# Patient Record
Sex: Female | Born: 1975 | Race: White | Hispanic: No | Marital: Single | Smoking: Current every day smoker
Health system: Southern US, Community
[De-identification: ages and names within clinical notes are randomized; demographics above are authoritative.]

## PROBLEM LIST (undated history)

## (undated) DIAGNOSIS — D72829 Elevated white blood cell count, unspecified: Secondary | ICD-10-CM

## (undated) DIAGNOSIS — L089 Local infection of the skin and subcutaneous tissue, unspecified: Secondary | ICD-10-CM

## (undated) DIAGNOSIS — K589 Irritable bowel syndrome without diarrhea: Secondary | ICD-10-CM

## (undated) HISTORY — DX: Elevated white blood cell count, unspecified: D72.829

## (undated) HISTORY — DX: Irritable bowel syndrome, unspecified: K58.9

## (undated) HISTORY — PX: NASAL SINUS SURGERY: SHX719

## (undated) HISTORY — DX: Local infection of the skin and subcutaneous tissue, unspecified: L08.9

---

## 2002-05-24 ENCOUNTER — Other Ambulatory Visit: Admission: RE | Admit: 2002-05-24 | Discharge: 2002-05-24 | Payer: Self-pay | Admitting: Obstetrics and Gynecology

## 2004-08-06 ENCOUNTER — Other Ambulatory Visit: Admission: RE | Admit: 2004-08-06 | Discharge: 2004-08-06 | Payer: Self-pay | Admitting: Family Medicine

## 2004-11-04 ENCOUNTER — Encounter: Admission: RE | Admit: 2004-11-04 | Discharge: 2004-11-04 | Payer: Self-pay | Admitting: Family Medicine

## 2005-07-19 ENCOUNTER — Encounter: Admission: RE | Admit: 2005-07-19 | Discharge: 2005-07-19 | Payer: Self-pay | Admitting: Family Medicine

## 2006-09-07 ENCOUNTER — Emergency Department (HOSPITAL_COMMUNITY): Admission: EM | Admit: 2006-09-07 | Discharge: 2006-09-08 | Payer: Self-pay | Admitting: Emergency Medicine

## 2009-03-13 ENCOUNTER — Encounter: Admission: RE | Admit: 2009-03-13 | Discharge: 2009-03-13 | Payer: Self-pay | Admitting: Family Medicine

## 2010-12-21 LAB — URINALYSIS, ROUTINE W REFLEX MICROSCOPIC
Glucose, UA: NEGATIVE
Ketones, ur: NEGATIVE
Specific Gravity, Urine: 1.009

## 2010-12-21 LAB — CBC
HCT: 37.2
Hemoglobin: 13
MCHC: 34.8
RBC: 4.1
WBC: 15.6 — ABNORMAL HIGH

## 2010-12-21 LAB — DIFFERENTIAL
Basophils Absolute: 0
Basophils Relative: 0
Eosinophils Absolute: 0.1
Lymphs Abs: 1.6
Neutrophils Relative %: 80 — ABNORMAL HIGH

## 2010-12-21 LAB — URINE MICROSCOPIC-ADD ON

## 2010-12-21 LAB — URINE CULTURE: Colony Count: 3000

## 2010-12-21 LAB — BASIC METABOLIC PANEL
CO2: 21
Creatinine, Ser: 0.7

## 2010-12-28 ENCOUNTER — Ambulatory Visit: Payer: Self-pay

## 2012-05-21 ENCOUNTER — Telehealth: Payer: Self-pay | Admitting: Oncology

## 2012-05-21 ENCOUNTER — Other Ambulatory Visit: Payer: Self-pay | Admitting: Oncology

## 2012-05-21 DIAGNOSIS — D72829 Elevated white blood cell count, unspecified: Secondary | ICD-10-CM

## 2012-05-21 NOTE — Telephone Encounter (Signed)
S/W PT IN REF TO NP APPT. ON 06/11/12@3 :00 REFERRING DR Dorothey Baseman DX-MILD CHRONIC ELEVATION OF WBC  MAILED NP PACKET

## 2012-05-21 NOTE — Telephone Encounter (Signed)
C/D 05/21/12 for appt.06/11/12

## 2012-06-11 ENCOUNTER — Ambulatory Visit (HOSPITAL_BASED_OUTPATIENT_CLINIC_OR_DEPARTMENT_OTHER): Payer: PRIVATE HEALTH INSURANCE

## 2012-06-11 ENCOUNTER — Encounter: Payer: Self-pay | Admitting: Oncology

## 2012-06-11 ENCOUNTER — Other Ambulatory Visit (HOSPITAL_BASED_OUTPATIENT_CLINIC_OR_DEPARTMENT_OTHER): Payer: PRIVATE HEALTH INSURANCE | Admitting: Lab

## 2012-06-11 ENCOUNTER — Telehealth: Payer: Self-pay | Admitting: Oncology

## 2012-06-11 ENCOUNTER — Ambulatory Visit (HOSPITAL_BASED_OUTPATIENT_CLINIC_OR_DEPARTMENT_OTHER): Payer: PRIVATE HEALTH INSURANCE | Admitting: Oncology

## 2012-06-11 VITALS — BP 123/87 | HR 85 | Temp 98.2°F | Resp 20 | Ht 62.0 in | Wt 172.2 lb

## 2012-06-11 DIAGNOSIS — D72829 Elevated white blood cell count, unspecified: Secondary | ICD-10-CM

## 2012-06-11 DIAGNOSIS — D72821 Monocytosis (symptomatic): Secondary | ICD-10-CM

## 2012-06-11 DIAGNOSIS — B999 Unspecified infectious disease: Secondary | ICD-10-CM

## 2012-06-11 DIAGNOSIS — D720 Genetic anomalies of leukocytes: Secondary | ICD-10-CM

## 2012-06-11 HISTORY — DX: Elevated white blood cell count, unspecified: D72.829

## 2012-06-11 LAB — CBC WITH DIFFERENTIAL/PLATELET
BASO%: 0.4 % (ref 0.0–2.0)
HCT: 40.7 % (ref 34.8–46.6)
HGB: 13.8 g/dL (ref 11.6–15.9)
MCHC: 33.9 g/dL (ref 31.5–36.0)
MONO#: 1 10*3/uL — ABNORMAL HIGH (ref 0.1–0.9)
MONO%: 9.6 % (ref 0.0–14.0)
NEUT#: 6.8 10*3/uL — ABNORMAL HIGH (ref 1.5–6.5)
WBC: 10.4 10*3/uL — ABNORMAL HIGH (ref 3.9–10.3)

## 2012-06-11 LAB — COMPREHENSIVE METABOLIC PANEL (CC13)
AST: 18 U/L (ref 5–34)
Albumin: 3.7 g/dL (ref 3.5–5.0)
Glucose: 98 mg/dl (ref 70–99)
Sodium: 138 mEq/L (ref 136–145)

## 2012-06-11 LAB — MORPHOLOGY: PLT EST: ADEQUATE

## 2012-06-11 LAB — CHCC SMEAR

## 2012-06-11 NOTE — Progress Notes (Signed)
Checked in new patient. No financial issues. °

## 2012-06-11 NOTE — Telephone Encounter (Signed)
Gave pt appt for October 2014 lab only

## 2012-06-11 NOTE — Patient Instructions (Addendum)
1.  Elevated White blood cell. 2.  Impression:  Chronic.  Most likely benign.  My review of peripheral blood smear was benign with no concern for leukemia/lymphoma.  3.  Recommendation:  Watchful observation.  In the future, if white blood cell significantly worsens, we may consider further work up.   4.  Follow up:  Lab test in about 6 months.  Return visit in about 1 year.

## 2012-06-13 ENCOUNTER — Telehealth: Payer: Self-pay | Admitting: Oncology

## 2012-06-13 NOTE — Progress Notes (Signed)
[facility]  Telephone:(336) [phone] Fax:(336) [phone]     INITIAL HEMATOLOGY CONSULTATION    Referral MD:  Tomasa Rand, R.N.  [employer].   Reason for Referral: chronic leukocytosis.     HPI: Sydney Spencer is a 37 year-old woman with IBS.  She has had chronic leukocytosis for many years.  The oldest available CBC provided dated back to 06/13/2005 where WBC 11.3; Htb 14.3; Plt 198; ANC 8.2; mono 1.0.  The latest CBC dated 04/07/2011 showed WBC 12.6; Hgb 14.3; plt 243. She also has recurrent soft tissue infections. She sustains cellulitis and abscess easily when she scraps her skin.  Therefore, her occupational health office kindly referred her to the Cancer for evaluation.  Sydney Spencer presented to clinic by herself today.  She has mild fatigue.  However, she works full time.  She denies active skin rash, cellulitis at this time.  She had intermittent adenopathy behind her ears and axilla.  There is no relation to the onset or temporary resolution of these swollen nodes.  She has mild hot flash which she attributes to early menopause.  She denied hot flash, anorexia, weight loss, fever, SOB, chest pain, cough, hemoptysis, bone pain, joint pain, joint swelling, skin rash, sun sensitivity, abdominal pain, diarrhea/constipation, melena, hematochezia, hematuria, bone pain.     Past Medical History  Diagnosis Date  . Leukocytosis, unspecified 06/11/2012  . IBS (irritable bowel syndrome)     colonoscopy around 2012 [facility]).   :    History reviewed. No pertinent past surgical history.:   CURRENT MEDS: Current Outpatient Prescriptions  Medication Sig Dispense Refill  . Ascorbic Acid (VITAMIN C) 100 MG tablet Take 100 mg by mouth daily.      . cholecalciferol (VITAMIN D) 1000 UNITS tablet Take 1,000 Units by mouth daily.      . fish oil-omega-3 fatty acids 1000 MG capsule Take 2 g by mouth daily.      . Melatonin 1 MG CAPS  Take by mouth. Take as directed      . vitamin B-12 (CYANOCOBALAMIN) 100 MCG tablet Take 50 mcg by mouth daily.       No current facility-administered medications for this visit.      Allergies  Allergen Reactions  . Latex Itching    And rash  :  Family History  Problem Relation Age of Onset  . Cancer Father     Lung?  . Cancer Paternal Grandmother     pancreas cancer  :  History   Social History  . Marital Status: Single    Spouse Name: N/A    Number of Children: 0  . Years of Education: N/A   Occupational History  .      [employer], [employer]   Social History Main Topics  . Smoking status: Current Every Day Smoker -- 1.00 packs/day for 20 years  . Smokeless tobacco: Never Used  . Alcohol Use: No  . Drug Use: Not on file  . Sexually Active: Not on file   Other Topics Concern  . Not on file   Social History Narrative  . No narrative on file  :  REVIEW OF SYSTEM:  The rest of the 14-point review of sytem was negative.   Exam: ECOG 0-1  General:  well-nourished woman, in no acute distress.  Eyes:  no scleral icterus.  ENT:  There were no oropharyngeal lesions.  Neck was without thyromegaly.  Lymphatics:  Negative posterior auricular,  cervical, supraclavicular, axillary, or inguinal adenopathy.  Respiratory: lungs were clear bilaterally without wheezing or crackles.  Cardiovascular:  Regular rate and rhythm, S1/S2, without murmur, rub or gallop.  There was no pedal edema.  GI:  abdomen was soft, flat, nontender, nondistended, without organomegaly.  Muscoloskeletal:  no spinal tenderness of palpation of vertebral spine.  Skin exam was without echymosis, petichae.  I did not identify any active skin erythema/purulent discharge/fluctulance today.  Neuro exam was nonfocal.  Patient was able to get on and off exam table without assistance.  Gait was normal.  Patient was alerted and oriented.  Attention was good.   Language was appropriate.  Mood was normal without  depression.  Speech was not pressured.  Thought content was not tangential.    LABS:  Lab Results  Component Value Date   WBC 10.4* 06/11/2012   HGB 13.8 06/11/2012   HCT 40.7 06/11/2012   PLT 196 06/11/2012   GLUCOSE 98 06/11/2012   ALT 18 06/11/2012   AST 18 06/11/2012   NA 138 06/11/2012   K 3.9 06/11/2012   CL 103 06/11/2012   CREATININE 0.7 06/11/2012   BUN 9.9 06/11/2012   CO2 25 06/11/2012    Blood smear review:   I personally reviewed the patient's peripheral blood smear today.  There was isocytosis.  There was no peripheral blast.  There was no schistocytosis, spherocytosis, target cell, rouleaux formation, tear drop cell.  There was no giant platelets or platelet clumps.      ASSESSMENT AND PLAN:   1.  Chronic neutrophilia and monocytosis: - Differentials:  Most likely reactive.  I have low pretest probability for an acute myeloproliferative process (acute leukemia) given that it has been stable since at least 2007.  Also, on differential is chronic myeloproliferative disease.  However, given lack of definitive constitutional symptoms or disease manifestation, in addition to chronic mild neutrophilia and monocytosis, a diagnostic bone marrow biopsy at this time is premature.  - Work up:  None indicated at this time. - Follow up:  Repeat CBC at the Cancer Center in about 6 months.  Return visit in about 1 year.  In the future, if her neutrophilia and monocytosis significantly worsen, a bone marrow biopsy may be considered.   2.  Recurrent soft tissue infection: - Differential:  Neutrophil dysfunction.  Patient was concerned about potential being Staph carrier. - Work up:  I will send for neutrophil function test.  I recommended her following up with Dr. Michaelle Birks for her expertise in working up Staph carrier status.    Sydney Spencer expressed informed understanding and wised to proceed as recommended.    The length of time of the face-to-face encounter was 30 minutes. More than 50% of time was  spent counseling and coordination of care.     Thank you for this referral.      Deriona Altemose T. Gaylyn Rong, M.D.

## 2012-06-13 NOTE — Telephone Encounter (Signed)
Talked to pt , gave her appt for lab 4/16 per Dr. Gaylyn Rong,

## 2012-06-20 ENCOUNTER — Other Ambulatory Visit (HOSPITAL_BASED_OUTPATIENT_CLINIC_OR_DEPARTMENT_OTHER): Payer: PRIVATE HEALTH INSURANCE | Admitting: Lab

## 2012-06-20 ENCOUNTER — Other Ambulatory Visit: Payer: Self-pay | Admitting: *Deleted

## 2012-06-20 DIAGNOSIS — D720 Genetic anomalies of leukocytes: Secondary | ICD-10-CM

## 2012-08-29 ENCOUNTER — Other Ambulatory Visit: Payer: Self-pay | Admitting: Oncology

## 2012-08-29 ENCOUNTER — Telehealth: Payer: Self-pay | Admitting: *Deleted

## 2012-08-29 ENCOUNTER — Encounter: Payer: Self-pay | Admitting: Oncology

## 2012-08-29 NOTE — Telephone Encounter (Signed)
Dr. Gaylyn Rong attempted to call pt back x 2 today and she did not answer.  He mailed letter to her home explaining lab results.

## 2012-08-29 NOTE — Telephone Encounter (Signed)
Pt requesting results of labs from visit in April.  She says "Dr. Gaylyn Rong did some extensive testing, but I have not heard about the results."

## 2012-12-10 ENCOUNTER — Telehealth: Payer: Self-pay | Admitting: *Deleted

## 2012-12-10 DIAGNOSIS — D72829 Elevated white blood cell count, unspecified: Secondary | ICD-10-CM

## 2012-12-10 NOTE — Telephone Encounter (Signed)
Pt asking about reason for lab appt this week?  Explained Dr. Gaylyn Rong recommended Labs in 6 months from last visit to make sure there are no changes and no need for any further testing.  Pt verbalized understanding and plans to keep appt this week.

## 2012-12-11 ENCOUNTER — Other Ambulatory Visit: Payer: Self-pay | Admitting: Hematology and Oncology

## 2012-12-11 DIAGNOSIS — D72829 Elevated white blood cell count, unspecified: Secondary | ICD-10-CM

## 2012-12-12 ENCOUNTER — Other Ambulatory Visit: Payer: PRIVATE HEALTH INSURANCE | Admitting: Lab

## 2012-12-12 ENCOUNTER — Telehealth: Payer: Self-pay | Admitting: Hematology and Oncology

## 2012-12-12 NOTE — Telephone Encounter (Signed)
returned pt call and r/s miss lab...done

## 2012-12-13 ENCOUNTER — Encounter (INDEPENDENT_AMBULATORY_CARE_PROVIDER_SITE_OTHER): Payer: Self-pay

## 2012-12-13 ENCOUNTER — Other Ambulatory Visit (HOSPITAL_BASED_OUTPATIENT_CLINIC_OR_DEPARTMENT_OTHER): Payer: PRIVATE HEALTH INSURANCE

## 2012-12-13 DIAGNOSIS — D72829 Elevated white blood cell count, unspecified: Secondary | ICD-10-CM

## 2012-12-13 LAB — CBC WITH DIFFERENTIAL/PLATELET
Basophils Absolute: 0.1 10*3/uL (ref 0.0–0.1)
EOS%: 1.4 % (ref 0.0–7.0)
Eosinophils Absolute: 0.3 10*3/uL (ref 0.0–0.5)
MCH: 31.5 pg (ref 25.1–34.0)
MONO#: 1.7 10*3/uL — ABNORMAL HIGH (ref 0.1–0.9)
NEUT#: 14.5 10*3/uL — ABNORMAL HIGH (ref 1.5–6.5)
NEUT%: 79.1 % — ABNORMAL HIGH (ref 38.4–76.8)
Platelets: 231 10*3/uL (ref 145–400)
WBC: 18.3 10*3/uL — ABNORMAL HIGH (ref 3.9–10.3)
lymph#: 1.8 10*3/uL (ref 0.9–3.3)

## 2012-12-13 LAB — URINALYSIS, MICROSCOPIC - CHCC
Bacteria, UA: NEGATIVE
Bilirubin (Urine): NEGATIVE
Ketones: NEGATIVE mg/dL
Leukocyte Esterase: NEGATIVE
Specific Gravity, Urine: 1.005 (ref 1.003–1.035)
WBC, UA: NEGATIVE (ref 0–2)

## 2013-01-03 ENCOUNTER — Telehealth: Payer: Self-pay | Admitting: *Deleted

## 2013-01-03 NOTE — Telephone Encounter (Signed)
Pt called for results of labs she had done earlier this month.

## 2013-01-03 NOTE — Telephone Encounter (Signed)
Her CBC is grossly abnormal Recommend she returns to see me for further evaluation Next available 30 minutes appt

## 2013-01-04 ENCOUNTER — Telehealth: Payer: Self-pay | Admitting: *Deleted

## 2013-01-04 ENCOUNTER — Telehealth: Payer: Self-pay | Admitting: Hematology and Oncology

## 2013-01-04 NOTE — Telephone Encounter (Signed)
s.w. pt and tried to give appt...pt did not want to sched appt due to work sched and wanted results over the phone transfered pt to [employer] nurse

## 2013-01-07 NOTE — Telephone Encounter (Signed)
Opened encounter in error  

## 2013-01-08 ENCOUNTER — Telehealth: Payer: Self-pay | Admitting: Hematology and Oncology

## 2013-01-08 NOTE — Telephone Encounter (Signed)
returned pt call and sched visit. 11.10.14

## 2013-01-08 NOTE — Telephone Encounter (Signed)
called pt back to make aware that appt on 11.10.14 has been moved to 11.7.14

## 2013-01-11 ENCOUNTER — Ambulatory Visit (HOSPITAL_BASED_OUTPATIENT_CLINIC_OR_DEPARTMENT_OTHER): Payer: PRIVATE HEALTH INSURANCE | Admitting: Hematology and Oncology

## 2013-01-11 ENCOUNTER — Encounter: Payer: Self-pay | Admitting: Hematology and Oncology

## 2013-01-11 VITALS — BP 129/86 | HR 77 | Temp 97.2°F | Resp 18 | Ht 62.0 in | Wt 168.7 lb

## 2013-01-11 DIAGNOSIS — L089 Local infection of the skin and subcutaneous tissue, unspecified: Secondary | ICD-10-CM

## 2013-01-11 DIAGNOSIS — F17200 Nicotine dependence, unspecified, uncomplicated: Secondary | ICD-10-CM

## 2013-01-11 DIAGNOSIS — D72829 Elevated white blood cell count, unspecified: Secondary | ICD-10-CM

## 2013-01-11 DIAGNOSIS — L732 Hidradenitis suppurativa: Secondary | ICD-10-CM

## 2013-01-11 MED ORDER — CLINDAMYCIN PHOSPHATE 1 % EX GEL: Freq: Two times a day (BID) | CUTANEOUS | Status: DC

## 2013-01-11 MED ORDER — CLINDAMYCIN PHOS-BENZOYL PEROX 1-5 % EX GEL: Freq: Two times a day (BID) | CUTANEOUS | Status: DC

## 2013-01-11 NOTE — Progress Notes (Signed)
[facility] OFFICE PROGRESS NOTE  Emeterio Reeve, MD DIAGNOSIS:  Chronic leukocytosis  SUMMARY OF HEMATOLOGIC HISTORY: This is a pleasant 37 year old lady with prior diagnosis of chronic leukocytosis. The abnormal CBC was discovered as part of the routine visit. The patient has history of recurrent skin infection. INTERVAL HISTORY: Douglas Rooks 37 y.o. female returns for further followup. She denies any atypical infection such as pneumonia requiring hospitalization or shingles. No prior history of meningitis. The typical skin infection she described are behind the ears, around her groin and underneath her armpits. She had been putting some topical cream behind the ears for long time and is not helping. Her last antibiotic treatment is more than 6 months ago.  I have reviewed the past medical history, past surgical history, social history and family history with the patient and they are unchanged from previous note.  ALLERGIES:  is allergic to latex.  MEDICATIONS:  Current Outpatient Prescriptions  Medication Sig Dispense Refill  . Ascorbic Acid (VITAMIN C) 100 MG tablet Take 100 mg by mouth daily.      . cholecalciferol (VITAMIN D) 1000 UNITS tablet Take 1,000 Units by mouth daily.      . fish oil-omega-3 fatty acids 1000 MG capsule Take 2 g by mouth daily.      . Melatonin 1 MG CAPS Take by mouth. Take as directed      . vitamin B-12 (CYANOCOBALAMIN) 100 MCG tablet Take 50 mcg by mouth daily.      . clindamycin (CLINDAGEL) 1 % gel Apply topically 2 (two) times daily.  60 g  3  . clindamycin-benzoyl peroxide (BENZACLIN) gel Apply topically 2 (two) times daily.  50 g  3   No current facility-administered medications for this visit.     REVIEW OF SYSTEMS:   Constitutional: Denies fevers, chills or night sweats Eyes: Denies blurriness of vision Ears, nose, mouth, throat, and face: Denies mucositis or sore throat Respiratory: Denies cough, dyspnea or  wheezes Cardiovascular: Denies palpitation, chest discomfort or lower extremity swelling Gastrointestinal:  Denies nausea, heartburn or change in bowel habits Lymphatics: Denies new lymphadenopathy or easy bruising Neurological:Denies numbness, tingling or new weaknesses Behavioral/Psych: Mood is stable, no new changes  All other systems were reviewed with the patient and are negative.  PHYSICAL EXAMINATION: ECOG PERFORMANCE STATUS: 0 - Asymptomatic  Filed Vitals:   01/11/13 1343  BP: 129/86  Pulse: 77  Temp: 97.2 F (36.2 C)  Resp: 18   Filed Weights   01/11/13 1343  Weight: 168 lb 11.2 oz (76.522 kg)    GENERAL:alert, no distress and comfortable SKIN: skin color, texture, turgor are normal, no rashes or significant lesions. Noticed scarring behind the ears and her groin area EYES: normal, Conjunctiva are pink and non-injected, sclera clear OROPHARYNX:no exudate, no erythema and lips, buccal mucosa, and tongue normal  NECK: supple, thyroid normal size, non-tender, without nodularity LYMPH:  no palpable lymphadenopathy in the cervical, axillary or inguinal LUNGS: clear to auscultation and percussion with normal breathing effort HEART: regular rate & rhythm and no murmurs and no lower extremity edema ABDOMEN:abdomen soft, non-tender and normal bowel sounds Musculoskeletal:no cyanosis of digits and no clubbing  NEURO: alert & oriented x 3 with fluent speech, no focal motor/sensory deficits  LABORATORY DATA:  I have reviewed the data as listed ASSESSMENT:  Recurrent leukocytosis, reactive in nature  PLAN:  #1 recurrent leukocytosis This is reactive in nature. The white count has fluctuate between low normal to a little high. The  predominant increased leukocytosis are neutrophilia and monocytosis, consistent with infection. The most likely source of infection is on her skin. The patient also has a smoker's cough. I do not feel this warrant further investigation. #2 chronic  skin infection This is consistent with chronic suppurative hydradenitis. I gave her some patient education information. She may need referral to another dermatologist for possible incision and drainage. I gave her a prescription of topical clindamycin cream as well as benzoyl peroxide to treat this. I encouraged her to quit smoking and reduce carbohydrate intake. We also focused on improving hygiene #3 chronic smoking I spent some time educating the patient importance of nicotine cessation All questions were answered. The patient knows to call the clinic with any problems, questions or concerns. No barriers to learning was detected.    Gilmore List, MD 01/11/2013 3:10 PM

## 2013-01-14 ENCOUNTER — Ambulatory Visit: Payer: PRIVATE HEALTH INSURANCE | Admitting: Hematology and Oncology

## 2013-06-03 ENCOUNTER — Ambulatory Visit
Admission: RE | Admit: 2013-06-03 | Discharge: 2013-06-03 | Disposition: A | Discharge status: Home / Self Care | Payer: PRIVATE HEALTH INSURANCE | Source: Ambulatory Visit | Attending: Family Medicine | Admitting: Family Medicine

## 2013-06-03 ENCOUNTER — Other Ambulatory Visit: Payer: Self-pay | Admitting: Family Medicine

## 2013-06-03 DIAGNOSIS — R079 Chest pain, unspecified: Secondary | ICD-10-CM

## 2013-07-02 ENCOUNTER — Ambulatory Visit: Payer: PRIVATE HEALTH INSURANCE | Admitting: Cardiovascular Disease

## 2016-02-08 ENCOUNTER — Other Ambulatory Visit: Payer: Self-pay | Admitting: Family Medicine

## 2016-02-08 ENCOUNTER — Other Ambulatory Visit (HOSPITAL_COMMUNITY)
Admission: RE | Admit: 2016-02-08 | Discharge: 2016-02-08 | Disposition: A | Discharge status: Home / Self Care | Payer: PRIVATE HEALTH INSURANCE | Source: Ambulatory Visit | Attending: Family Medicine | Admitting: Family Medicine

## 2016-02-08 DIAGNOSIS — Z01411 Encounter for gynecological examination (general) (routine) with abnormal findings: Secondary | ICD-10-CM | POA: Insufficient documentation

## 2016-02-08 DIAGNOSIS — Z1151 Encounter for screening for human papillomavirus (HPV): Secondary | ICD-10-CM | POA: Diagnosis present

## 2016-02-10 LAB — CYTOLOGY - PAP
Diagnosis: NEGATIVE
HPV: NOT DETECTED

## 2016-03-15 DIAGNOSIS — J310 Chronic rhinitis: Secondary | ICD-10-CM | POA: Insufficient documentation

## 2016-03-15 DIAGNOSIS — J343 Hypertrophy of nasal turbinates: Secondary | ICD-10-CM | POA: Insufficient documentation

## 2016-03-15 DIAGNOSIS — J342 Deviated nasal septum: Secondary | ICD-10-CM | POA: Insufficient documentation

## 2016-04-27 DIAGNOSIS — J341 Cyst and mucocele of nose and nasal sinus: Secondary | ICD-10-CM | POA: Insufficient documentation

## 2016-08-04 ENCOUNTER — Other Ambulatory Visit: Payer: Self-pay | Admitting: Otolaryngology

## 2016-08-11 ENCOUNTER — Emergency Department
Admission: EM | Admit: 2016-08-11 | Discharge: 2016-08-11 | Disposition: A | Discharge status: Home / Self Care | Payer: PRIVATE HEALTH INSURANCE | Attending: Emergency Medicine | Admitting: Emergency Medicine

## 2016-08-11 ENCOUNTER — Emergency Department: Payer: PRIVATE HEALTH INSURANCE

## 2016-08-11 ENCOUNTER — Encounter: Payer: Self-pay | Admitting: Emergency Medicine

## 2016-08-11 DIAGNOSIS — Y929 Unspecified place or not applicable: Secondary | ICD-10-CM | POA: Diagnosis not present

## 2016-08-11 DIAGNOSIS — Y999 Unspecified external cause status: Secondary | ICD-10-CM | POA: Insufficient documentation

## 2016-08-11 DIAGNOSIS — Y939 Activity, unspecified: Secondary | ICD-10-CM | POA: Insufficient documentation

## 2016-08-11 DIAGNOSIS — Z79899 Other long term (current) drug therapy: Secondary | ICD-10-CM | POA: Diagnosis not present

## 2016-08-11 DIAGNOSIS — Z91040 Latex allergy status: Secondary | ICD-10-CM | POA: Diagnosis not present

## 2016-08-11 DIAGNOSIS — S298XXA Other specified injuries of thorax, initial encounter: Secondary | ICD-10-CM | POA: Diagnosis present

## 2016-08-11 DIAGNOSIS — S20229A Contusion of unspecified back wall of thorax, initial encounter: Secondary | ICD-10-CM | POA: Diagnosis not present

## 2016-08-11 DIAGNOSIS — F17200 Nicotine dependence, unspecified, uncomplicated: Secondary | ICD-10-CM | POA: Diagnosis not present

## 2016-08-11 DIAGNOSIS — S2020XA Contusion of thorax, unspecified, initial encounter: Secondary | ICD-10-CM

## 2016-08-11 DIAGNOSIS — W1831XA Fall on same level due to stepping on an object, initial encounter: Secondary | ICD-10-CM | POA: Insufficient documentation

## 2016-08-11 DIAGNOSIS — W19XXXA Unspecified fall, initial encounter: Secondary | ICD-10-CM

## 2016-08-11 DIAGNOSIS — S20212A Contusion of left front wall of thorax, initial encounter: Secondary | ICD-10-CM | POA: Diagnosis not present

## 2016-08-11 MED ORDER — HYDROCODONE-ACETAMINOPHEN 5-325 MG PO TABS: 1.0000 | ORAL_TABLET | ORAL | 0 refills | Status: DC | PRN

## 2016-08-11 NOTE — Discharge Instructions (Signed)
Take ibuprofen as needed for pain and inflammation. Take Norco only when you at home if needed for pain. Use ice packs to ribs as needed for discomfort. Follow-up with your primary care provider is any continued problems more than 10-14 days.

## 2016-08-11 NOTE — ED Provider Notes (Signed)
[facility] Emergency Department Provider Note  ____________________________________________   First MD Initiated Contact with Patient 08/11/16 [phone]     (approximate)  I have reviewed the triage vital signs and the nursing notes.   HISTORY  Chief Complaint Fall    HPI Sydney Spencer is a 41 y.o. female is here with complaint of left posterior rib pain and back pain after she slipped on a tennis ball last evening. Patient states that she hit some bricks steps when she did this. She denies any head injury or loss of consciousness. Today pain has increased with inspiration. She denies any visual problems, nausea or vomiting. Tetanus immunization is current. Currently she rates her pain as 10 over 10.  Past Medical History:  Diagnosis Date  . IBS (irritable bowel syndrome)    colonoscopy around 2012 [facility]).   . Leukocytosis, unspecified 06/11/2012  . Skin infection     Patient Active Problem List   Diagnosis Date Noted  . 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 Leukocytosis, unspecified 06/11/2012    Past Surgical History:  Procedure Laterality Date  . NASAL SINUS SURGERY      Prior to Admission medications   Medication Sig Start Date End Date Taking? Authorizing Provider  Ascorbic Acid (VITAMIN C) 100 MG tablet Take 100 mg by mouth daily.    [provider]  cholecalciferol (VITAMIN D) 1000 UNITS tablet Take 1,000 Units by mouth daily.    [provider]  fish oil-omega-3 fatty acids 1000 MG capsule Take 2 g by mouth daily.    [provider]  HYDROcodone-acetaminophen (NORCO/VICODIN) 5-325 MG tablet Take 1 tablet by mouth every 4 (four) hours as needed for moderate pain. 08/11/16   Tommi Rumps, PA-C  Melatonin 1 MG CAPS Take by mouth. Take as directed    [provider]  vitamin B-12 (CYANOCOBALAMIN) 100 MCG tablet Take 50 mcg by mouth daily.    [provider]     Allergies Latex  Family History  Problem Relation Age of Onset  . Cancer Father        Lung?  . Cancer Paternal Grandmother        pancreas cancer    Social History Social History  Substance Use Topics  . Smoking status: Current Every Day Smoker    Packs/day: 1.00    Years: 20.00  . Smokeless tobacco: Never Used  . Alcohol use No    Review of Systems ` Constitutional: No fever/chills Eyes: No visual changes. ENT: Recent nasal surgery but no recent trauma. Cardiovascular: Denies chest pain. Respiratory: Denies shortness of breath. Positive for rib pain. Gastrointestinal: No abdominal pain.  No nausea, no vomiting.   Genitourinary: Negative for dysuria. Musculoskeletal: Positive for thoracic and left rib pain. Skin: Positive for abrasion midthoracic spine and left posterior ribs. Neurological: Negative for headaches, focal weakness or numbness.   ____________________________________________   PHYSICAL EXAM:  VITAL SIGNS: ED Triage Vitals  Enc Vitals Group     BP 08/11/16 0838 (!) 141/94     Pulse Rate 08/11/16 0838 81     Resp 08/11/16 0838 18     Temp 08/11/16 0838 98.2 F (36.8 C)     Temp Source 08/11/16 0838 Oral     SpO2 08/11/16 0838 100 %     Weight 08/11/16 0838 137 lb (62.1 kg)     Height 08/11/16 0838 5\' 4"  (1.626 m)     Head Circumference --      Peak  Flow --      Pain Score 08/11/16 0841 10     Pain Loc --      Pain Edu? --      Excl. in GC? --     Constitutional: Alert and oriented. Well appearing and in no acute distress. Eyes: Conjunctivae are normal. PERRL. EOMI. Head: Atraumatic. Nose: No acute trauma. Neck: No stridor.  Nontender cervical spine to palpation posteriorly. Cardiovascular: Normal rate, regular rhythm. Grossly normal heart sounds.  Good peripheral circulation. Respiratory: Normal respiratory effort.  No retractions. Lungs CTAB. No deformity is noted on examination of the left posterior lateral ribs. Marked tenderness  on light palpation. No crepitus was noted. Musculoskeletal: Moves upper and lower extremities without any difficulty. Normal gait was noted. Neurologic:  Normal speech and language. No gross focal neurologic deficits are appreciated. No gait instability. Skin:  Skin is warm, dry. There is a superficial abrasion noted to the left posterior ribs without active bleeding. There is also a superficial abrasion noted to the mid thoracic spine also with no active bleeding. Psychiatric: Mood and affect are normal. Speech and behavior are normal.  ____________________________________________   LABS (all labs ordered are listed, but only abnormal results are displayed)  Labs Reviewed - No data to display    RADIOLOGY  Dg Ribs Unilateral W/chest Left  Result Date: 08/11/2016 CLINICAL DATA:  Status post slipping and falling last evening. Left mid back and posterior ribcage discomfort with a pleuritic component. EXAM: LEFT RIBS AND CHEST - 3+ VIEW COMPARISON:  Thoracic spine series of today's date and PA and lateral chest x-ray dated June 03, 2013 FINDINGS: The lungs are well-expanded. There is no evidence of a pulmonary contusion, pneumothorax, or pleural effusion. The heart and pulmonary vascularity are normal. There is mild levocurvature of the lower thoracic spine. Left rib detail images include a metallic BB over the lower posterior ribs. No underlying rib deformity is observed. IMPRESSION: There is no acute bony thoracic abnormality. Specific attention to the left lower ribs reveals no acute fracture. There is stable mild levocurvature of the lower thoracic spine. No acute cardiopulmonary abnormality. Electronically Signed   By: David  Swazil[place]Jordan M.D.   On: 08/11/2016 09:36   Dg Thoracic Spine 2 View  Result Date: 08/11/2016 CLINICAL DATA:  Patient porch slipping on a tennis ball last night resulting in a fall. The patient reports mid left back and posterior ribcage pain with a pleuritic component. EXAM:  THORACIC SPINE 2 VIEWS COMPARISON:  Chest x-ray of September 03, 2013 FINDINGS: The thoracic vertebral bodies are preserved in height. There is mild curvature convex toward the left centered at T9-10. The pedicles are intact. There are no abnormal paravertebral soft tissue densities. The disc space heights are well maintained. IMPRESSION: There is no acute bony abnormality of the thoracic spine. There is chronic mild levocurvature centered at T9-10. Electronically Signed   By: David  [place] M.D.   On: 08/11/2016 09:34    ____________________________________________   PROCEDURES  Procedure(s) performed: None  Procedures  Critical Care performed: No  ____________________________________________   INITIAL IMPRESSION / ASSESSMENT AND PLAN / ED COURSE  Pertinent labs & imaging results that were available during my care of the patient were reviewed by me and considered in my medical decision making (see chart for details).  Left rib x-ray and thoracic spine was reassuring that there is no acute injury. Patient was made aware. Patient will take ibuprofen 3 times a day with food. Also she was given a  prescription for Norco one every 4 hours as needed for pain. She is aware that she cannot drive while taking this medication and a note for work was given for the next 2 days.      ____________________________________________   FINAL CLINICAL IMPRESSION(S) / ED DIAGNOSES  Final diagnoses:  Contusion of ribs, left, initial encounter  Contusion of thorax, initial encounter  Fall, initial encounter      NEW MEDICATIONS STARTED DURING THIS VISIT:  Discharge Medication List as of 08/11/2016  9:51 AM    START taking these medications   Details  HYDROcodone-acetaminophen (NORCO/VICODIN) 5-325 MG tablet Take 1 tablet by mouth every 4 (four) hours as needed for moderate pain., Starting Thu 08/11/2016, Print         Note:  This document was prepared using Dragon voice recognition software and  may include unintentional dictation errors.    Tommi Rumps, PA-C 08/11/16 1549    Loleta Rose, MD 08/14/16 [phone]

## 2016-08-11 NOTE — ED Triage Notes (Signed)
States she slipped in tennis ball last pm.  Having pain to left mid back  Post rib area  Pain increased with inspiration

## 2016-09-13 ENCOUNTER — Ambulatory Visit: Payer: Self-pay | Admitting: Registered Nurse

## 2016-09-13 VITALS — BP 133/90 | HR 85 | Temp 98.3°F

## 2016-09-13 DIAGNOSIS — H1013 Acute atopic conjunctivitis, bilateral: Secondary | ICD-10-CM

## 2016-09-13 DIAGNOSIS — J309 Allergic rhinitis, unspecified: Principal | ICD-10-CM

## 2016-09-13 MED ORDER — CETIRIZINE HCL 10 MG PO TABS: 10.0000 mg | ORAL_TABLET | Freq: Every day | ORAL | 11 refills | Status: DC

## 2016-09-13 MED ORDER — CARBOXYMETHYLCELLULOSE SODIUM 1 % OP SOLN: 1.0000 [drp] | Freq: Three times a day (TID) | OPHTHALMIC | 12 refills | Status: DC

## 2016-09-13 NOTE — Patient Instructions (Signed)
Allergic Conjunctivitis, Adult      Allergic conjunctivitis is inflammation of the clear membrane that covers the white part of your eye and the inner surface of your eyelid (conjunctiva). The inflammation is caused by allergies. The blood vessels in the conjunctiva become inflamed and this causes the eyes to become red or pink. The eyes often feel itchy. Allergic conjunctivitis cannot be spread from one person to another person (is not contagious). What are the causes? This condition is caused by an allergic reaction. Common causes of an allergic reaction (allergens) include:  Outdoor allergens, such as: ? Pollen. ? Grass and weeds. ? Mold spores.  Indoor allergens, such as: ? Dust. ? Smoke. ? Mold. ? Pet dander. ? Animal hair.  What increases the risk? You may be more likely to develop this condition if you have a family history of allergies, such as:  Allergic rhinitis.  Bronchial asthma.  Atopic dermatitis.  What are the signs or symptoms? Symptoms of this condition include eyes that are:  Itchy.  Red.  Watery.  Puffy.  Your eyes may also:  Sting or burn.  Have clear drainage coming from them.  How is this diagnosed? This condition may be diagnosed by medical history and physical exam. If you have drainage from your eyes, it may be tested to rule out other causes of conjunctivitis. You may also need to see a health care provider who specializes in treating allergies (allergist) or eye conditions (ophthalmologist) for tests to confirm the diagnosis. You may have:  Skin tests to see which allergens are causing your symptoms. These tests involve pricking the skin with a tiny needle and exposing the skin to small amounts of potential allergens to see if your skin reacts.  Blood tests.  Tissue scrapings from your eyelid. These will be examined under a microscope.  How is this treated? Treatments for this condition may include:  Cold cloths (compresses) to  soothe itching and swelling.  Washing the face to remove allergens.  Eye drops. These may be prescription or over-the-counter. There are several different types. You may need to try different types to see which one works best for you. Your may need: ? Eye drops that block the allergic reaction (antihistamine). ? Eye drops that reduce swelling and irritation (anti-inflammatory). ? Steroid eye drops to lessen a severe reaction (vernal conjunctivitis).  Oral antihistamine medicines to reduce your allergic reaction. You may need these if eye drops do not help or are difficult to use.  Follow these instructions at home:  Avoid known allergens whenever possible.  Take or apply over-the-counter and prescription medicines only as told by your health care provider. These include any eye drops.  Apply a cool, clean washcloth to your eye for 10-20 minutes, 3-4 times a day.  Do not touch or rub your eyes.  Do not wear contact lenses until the inflammation is gone. Wear glasses instead.  Do not wear eye makeup until the inflammation is gone.  Keep all follow-up visits as told by your health care provider. This is important. Contact a health care provider if:  Your symptoms get worse or do not improve with treatment.  You have mild eye pain.  You have sensitivity to light.  You have spots or blisters on your eyes.  You have pus draining from your eye.  You have a fever. Get help right away if:  You have redness, swelling, or other symptoms in only one eye.  Your vision is blurred or you have   vision changes.  You have severe eye pain. This information is not intended to replace advice given to you by your health care provider. Make sure you discuss any questions you have with your health care provider. Document Released: 05/14/2002 Document Revised: 10/21/2015 Document Reviewed: 09/04/2015 Elsevier Interactive Patient Education  2018 [employer]  

## 2016-09-13 NOTE — Progress Notes (Signed)
Subjective:    Patient ID: Sydney Spencer, female    DOB: [birth year], 41 y.o.   MRN: [id number]  41y/o single caucasian female established patient reports L eye pain, a gritty sensation, x1 week, progressively worsening. Now effecting R eye in last 2 days. Waking up with some hard clumps of yellow discharge but not eyes matted shut. Clear, watery drainage during the day. Itchy, painful. Using OTC pink eye relief drops with some decrease of symptoms.  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  Doesn't always wear eye protection.  Denied vision changes.  Has follow up with ENT surgeon next week s/p polyp removal; and reduction sinuses feeling a little pressure was told to stop flonase until cleared post op hoping that will be next week.      Review of Systems  Constitutional: Negative for activity change, appetite change, chills, diaphoresis, fatigue, fever and unexpected weight change.  HENT: Positive for congestion, postnasal drip and sinus pressure. Negative for dental problem, drooling, ear discharge, ear pain, facial swelling, hearing loss, mouth sores, nosebleeds, rhinorrhea, sinus pain, sneezing, sore throat, tinnitus, trouble swallowing and voice change.   Eyes: Positive for discharge and itching. Negative for photophobia, pain, redness and visual disturbance.  Respiratory: Negative for cough, choking, chest tightness, shortness of breath, wheezing and stridor.   Cardiovascular: Negative for chest pain, palpitations and leg swelling.  Gastrointestinal: Negative for abdominal distention, abdominal pain, blood in stool, constipation, diarrhea, nausea and vomiting.  Endocrine: Negative for cold intolerance and heat intolerance.  Genitourinary: Negative for difficulty urinating, dysuria and hematuria.  Musculoskeletal: Negative for arthralgias, back pain, gait problem, joint swelling, myalgias, neck pain and neck stiffness.  Skin: Negative  for color change, pallor, rash and wound.  Allergic/Immunologic: Positive for environmental allergies. Negative for food allergies.  Neurological: Positive for headaches. Negative for dizziness, tremors, seizures, syncope, facial asymmetry, speech difficulty, weakness, light-headedness and numbness.  Hematological: Negative for adenopathy. Does not bruise/bleed easily.  Psychiatric/Behavioral: Negative for agitation, behavioral problems, confusion and sleep disturbance.       Objective:   Physical Exam  Constitutional: She is oriented to person, place, and time. She appears well-developed and well-nourished. She is active and cooperative.  Non-toxic appearance. She does not have a sickly appearance. She does not appear ill. No distress.  HENT:  Head: Normocephalic and atraumatic.  Right Ear: Hearing, external ear and ear canal normal. A middle ear effusion is present.  Left Ear: Hearing, external ear and ear canal normal. A middle ear effusion is present.  Nose: Mucosal edema and rhinorrhea present. No nose lacerations, sinus tenderness, nasal deformity, septal deviation or nasal septal hematoma. No epistaxis.  No foreign bodies. Right sinus exhibits no maxillary sinus tenderness and no frontal sinus tenderness. Left sinus exhibits no maxillary sinus tenderness and no frontal sinus tenderness.  Mouth/Throat: Uvula is midline and mucous membranes are normal. Mucous membranes are not pale, not dry and not cyanotic. She does not have dentures. No oral lesions. No trismus in the jaw. Normal dentition. No dental abscesses, uvula swelling, lacerations or dental caries. Posterior oropharyngeal edema and posterior oropharyngeal erythema present. No oropharyngeal exudate or tonsillar abscesses.  Eyes: EOM and lids are normal. Pupils are equal, round, and reactive to light. Right eye exhibits no chemosis, no discharge, no exudate and no hordeolum. No foreign body present in the right eye. Left eye exhibits no  chemosis, no discharge, no exudate and no hordeolum. No foreign  body present in the left eye. Right conjunctiva is injected. Right conjunctiva has no hemorrhage. Left conjunctiva is injected. Left conjunctiva has no hemorrhage. No scleral icterus. Right eye exhibits normal extraocular motion and no nystagmus. Left eye exhibits normal extraocular motion and no nystagmus. Right pupil is round and reactive. Left pupil is round and reactive. Pupils are equal.  Eyelid conjunctiva 1-2+/4 injection bilaterally equal  Neck: Trachea normal, normal range of motion and phonation normal. Neck supple. No tracheal tenderness, no spinous process tenderness and no muscular tenderness present. No neck rigidity. No tracheal deviation, no edema, no erythema and normal range of motion present. No thyroid mass and no thyromegaly present.  Cardiovascular: Normal rate, regular rhythm, S1 normal, S2 normal, normal heart sounds and intact distal pulses.  PMI is not displaced.  Exam reveals no gallop and no friction rub.   No murmur heard. Pulmonary/Chest: Effort normal and breath sounds normal. No accessory muscle usage or stridor. No respiratory distress. She has no decreased breath sounds. She has no wheezes. She has no rhonchi. She has no rales. She exhibits no tenderness.  Abdominal: Soft. She exhibits no distension.  Musculoskeletal: Normal range of motion. She exhibits no edema or tenderness.       Right shoulder: Normal.       Left shoulder: Normal.       Right elbow: Normal.      Left elbow: Normal.       Right hip: Normal.       Left hip: Normal.       Right knee: Normal.       Left knee: Normal.       Cervical back: Normal.       Right hand: Normal.       Left hand: Normal.  Lymphadenopathy:       Head (right side): No submental, no submandibular, no tonsillar, no preauricular, no posterior auricular and no occipital adenopathy present.       Head (left side): No submental, no submandibular, no tonsillar, no  preauricular, no posterior auricular and no occipital adenopathy present.    She has no cervical adenopathy.       Right cervical: No superficial cervical, no deep cervical and no posterior cervical adenopathy present.      Left cervical: No superficial cervical, no deep cervical and no posterior cervical adenopathy present.  Neurological: She is alert and oriented to person, place, and time. She has normal strength. She is not disoriented. She displays no atrophy and no tremor. No cranial nerve deficit or sensory deficit. She exhibits normal muscle tone. She displays no seizure activity. Coordination and gait normal. GCS eye subscore is 4. GCS verbal subscore is 5. GCS motor subscore is 6.  On/off exam table; in/out of chair without difficulty; gait sure and steady in hall  Skin: Skin is warm, dry and intact. No abrasion, no bruising, no burn, no ecchymosis, no laceration, no lesion, no petechiae and no rash noted. She is not diaphoretic. No cyanosis or erythema. No pallor. Nails show no clubbing.  Psychiatric: She has a normal mood and affect. Her speech is normal and behavior is normal. Judgment and thought content normal. Cognition and memory are normal.  Nursing note and vitals reviewed.         Assessment & Plan:  A-bilateral allergic conjunctivitis acute and acute rhinosinusitis; bilateral otitis media effusion  P-keep ENT appt next Wednesday discuss with specialist when to restart nasal steroid to prevent recurrence polyp; restart otc  antihistamine  Discussed with patient trigeminal nerve irritation could cause pain she desribed due to sinus irritation/inflammation   Patient may use normal saline nasal spray as needed.  Consider antihistamine or nasal steroid use.  Avoid triggers if possible.  Shower prior to bedtime if exposed to triggers.  If allergic dust/dust mites recommend mattress/pillow covers/encasements; washing linens, vacuuming, sweeping, dusting weekly.  Call or return to  clinic as needed if these symptoms worsen or fail to improve as anticipated.   Exitcare handout on allergic rhinitis given to patient.  Patient verbalized understanding of instructions, agreed with plan of care and had no further questions at this time.  P2:  Avoidance and hand washing.  Given 5 UD refresh instill 1-2gtts TID prn eye irritation, wear safety glasses; shower twice a day consider ketotifen 1 gtt ou BID prn allergies  Cleared for work.   Start antihistamine medication po at home also.  Shower after allergen exposure prior to bed.  Hygiene discussed. Patient to apply warm packs prn as directed.  Instructed patient to not rub eyes.  May use over the counter eye drops/tears for pain/symptom relief.  Return to clinic if headache, fever greater than 100.15F, nausea/vomiting, purulent discharge/matting unable to open eye without using fingers, foreign body sensation, ciliary flush, worsening photophobia or vision.  Call or return to clinic as needed if these symptoms worsen or fail to improve as anticipated.  Patient given Exitcare handout on allergic conjunctivitis.  Patient verbalized agreement and understanding of treatment plan and had no further questions at this time.   P2:  Hand washing, avoid contact use-wear glasses.

## 2017-02-14 ENCOUNTER — Ambulatory Visit: Payer: Self-pay | Admitting: Registered Nurse

## 2017-02-14 VITALS — BP 120/86 | HR 87 | Temp 97.0°F

## 2017-02-14 DIAGNOSIS — S0501XA Injury of conjunctiva and corneal abrasion without foreign body, right eye, initial encounter: Secondary | ICD-10-CM

## 2017-02-14 DIAGNOSIS — R300 Dysuria: Secondary | ICD-10-CM

## 2017-02-14 LAB — POCT URINALYSIS DIPSTICK
BILIRUBIN UA: NEGATIVE
GLUCOSE UA: NEGATIVE
Ketones, UA: NEGATIVE
LEUKOCYTES UA: NEGATIVE
Nitrite, UA: NEGATIVE
PH UA: 6 (ref 5.0–8.0)
Protein, UA: NEGATIVE
Spec Grav, UA: 1.02 (ref 1.010–1.025)
Urobilinogen, UA: 0.2 E.U./dL

## 2017-02-14 MED ORDER — CARBOXYMETHYLCELLULOSE SODIUM 1 % OP SOLN: 2.0000 [drp] | Freq: Three times a day (TID) | OPHTHALMIC | 12 refills | Status: AC

## 2017-02-14 MED ORDER — ERYTHROMYCIN 5 MG/GM OP OINT: 1.0000 "application " | TOPICAL_OINTMENT | Freq: Four times a day (QID) | OPHTHALMIC | 0 refills | Status: AC

## 2017-02-14 MED ORDER — PHENAZOPYRIDINE HCL 200 MG PO TABS: 200.0000 mg | ORAL_TABLET | Freq: Three times a day (TID) | ORAL | 0 refills | Status: DC

## 2017-02-14 NOTE — Progress Notes (Signed)
Subjective:    Patient ID: Sydney Spencer, female    DOB: Aug 04, 1975, 41 y.o.   MRN: [id number]  41y/o caucasian female established Pt reports burning with end of urination, frequency (decreased amount more often), pressure x24 hrs.  Did have sexual intercourse with water based lubricant in the past week.  Denied history recurrent UTIs/kidney stones.  Was cleaning up fallen tree limbs this weekend on her property also and evergreen branch got stuck in fence and then whipped her in face.  She thinks it punctured R eye on white of her eyeball 4 days ago because there was a red spot.  Irrigated with eye wash solution commercial and has been instilling refresh equivalent prn.  Eyelids swell at end of day.  Didn't get black eye, purulent discharge or bleeding from eye.  Rested day of trauma. Has had blurry vision the past couple days but improved today. Some watery drainage since injury. Denied headache, nausea, vomiting or loss of visual field.        Review of Systems  Constitutional: Negative for activity change, appetite change, chills, diaphoresis, fatigue and fever.  HENT: Positive for postnasal drip and rhinorrhea. Negative for facial swelling, mouth sores, nosebleeds, sinus pressure, sinus pain, trouble swallowing and voice change.   Eyes: Positive for pain, discharge, redness and visual disturbance. Negative for photophobia and itching.  Respiratory: Negative for cough.   Cardiovascular: Negative for leg swelling.  Gastrointestinal: Positive for abdominal pain. Negative for abdominal distention, anal bleeding, blood in stool, constipation, diarrhea, nausea and vomiting.  Endocrine: Negative for cold intolerance and heat intolerance.  Genitourinary: Positive for decreased urine volume, dysuria, frequency and urgency. Negative for difficulty urinating, enuresis, flank pain, genital sores, hematuria, menstrual problem, pelvic pain, vaginal bleeding, vaginal discharge and vaginal pain.   Musculoskeletal: Negative for arthralgias, back pain, gait problem, joint swelling, myalgias, neck pain and neck stiffness.  Skin: Negative for color change, pallor, rash and wound.  Allergic/Immunologic: Positive for environmental allergies. Negative for food allergies and immunocompromised state.  Neurological: Negative for dizziness, tremors, seizures, syncope, facial asymmetry, speech difficulty, weakness, light-headedness, numbness and headaches.  Hematological: Negative for adenopathy. Does not bruise/bleed easily.  Psychiatric/Behavioral: Negative for agitation, confusion and sleep disturbance.       Objective:   Physical Exam  Constitutional: She is oriented to person, place, and time. Vital signs are normal. She appears well-developed and well-nourished. She is active and cooperative.  Non-toxic appearance. She does not have a sickly appearance. She does not appear ill. No distress.  HENT:  Head: Normocephalic and atraumatic.  Right Ear: Hearing, external ear and ear canal normal.  Left Ear: Hearing, external ear and ear canal normal.  Nose: Mucosal edema and rhinorrhea present. No nose lacerations, sinus tenderness, nasal deformity, septal deviation or nasal septal hematoma. No epistaxis.  No foreign bodies.  Mouth/Throat: Uvula is midline and mucous membranes are normal. Mucous membranes are not pale, not dry and not cyanotic. She does not have dentures. No oral lesions. No trismus in the jaw. Normal dentition. No dental abscesses, uvula swelling, lacerations or dental caries. Posterior oropharyngeal edema and posterior oropharyngeal erythema present. No oropharyngeal exudate or tonsillar abscesses.  Cobblestoning posterior pharynx; bilateral allergic shiners; bilateral nasal turbinates edema/erythema clear discharge   Eyes: EOM and lids are normal. Pupils are equal, round, and reactive to light. Right eye exhibits no chemosis, no discharge, no exudate and no hordeolum. No foreign  body present in the right eye. Left eye exhibits no chemosis, no discharge,  no exudate and no hordeolum. No foreign body present in the left eye. Right conjunctiva is injected. Right conjunctiva has no hemorrhage. Left conjunctiva is not injected. Left conjunctiva has no hemorrhage. No scleral icterus. Right eye exhibits normal extraocular motion and no nystagmus. Left eye exhibits normal extraocular motion and no nystagmus. Right pupil is round and reactive. Left pupil is round and reactive. Pupils are equal.  Slit lamp exam:      The right eye shows no corneal abrasion, no corneal flare, no corneal ulcer, no foreign body, no hyphema, no hypopyon, no fluorescein uptake and no anterior chamber bulge.    Nummular erythema medial to right pupil 3mm diameter; slight excoration vasculature right conjunctiva bulbar; evaluation woods lamp and fluorescein negative fluorescein uptake right eye; snellen distant without correction 20/30 right 20/20 left and bilaterally; lower right eyelid slight nonpitting edema 0-1+/4; no discharge  Neck: Trachea normal, normal range of motion and phonation normal. Neck supple. No tracheal tenderness present. No neck rigidity. No tracheal deviation, no edema, no erythema and normal range of motion present. No thyroid mass and no thyromegaly present.  Cardiovascular: Normal rate, regular rhythm, S1 normal, S2 normal, normal heart sounds and intact distal pulses. PMI is not displaced. Exam reveals no gallop and no friction rub.  No murmur heard. Pulmonary/Chest: Effort normal and breath sounds normal. No accessory muscle usage or stridor. No respiratory distress. She has no decreased breath sounds. She has no wheezes. She has no rhonchi. She has no rales. She exhibits no tenderness.  Abdominal: Soft. Normal appearance and bowel sounds are normal. She exhibits no shifting dullness, no distension, no pulsatile liver, no fluid wave, no abdominal bruit, no ascites, no pulsatile midline  mass and no mass. There is no hepatosplenomegaly. There is tenderness in the suprapubic area. There is no rigidity, no rebound, no guarding, no CVA tenderness, no tenderness at McBurney's point and negative Murphy's sign. Hernia confirmed negative in the ventral area.    Mild suprapubic tenderness with palpation more pressure; dull to percussion x 3 quads; tympanny RLQ  Musculoskeletal: Normal range of motion. She exhibits no edema or tenderness.       Right shoulder: Normal.       Left shoulder: Normal.       Right elbow: Normal.      Left elbow: Normal.       Right hip: Normal.       Left hip: Normal.       Right knee: Normal.       Left knee: Normal.       Cervical back: Normal.       Thoracic back: Normal.       Lumbar back: Normal.       Right hand: Normal.       Left hand: Normal.  Lymphadenopathy:       Head (right side): No submental, no submandibular, no tonsillar, no preauricular, no posterior auricular and no occipital adenopathy present.       Head (left side): No submental, no submandibular, no tonsillar, no preauricular, no posterior auricular and no occipital adenopathy present.    She has no cervical adenopathy.       Right cervical: No superficial cervical, no deep cervical and no posterior cervical adenopathy present.      Left cervical: No superficial cervical, no deep cervical and no posterior cervical adenopathy present.  Neurological: She is alert and oriented to person, place, and time. She has normal strength. She is not  disoriented. She displays no atrophy and no tremor. No cranial nerve deficit or sensory deficit. She exhibits normal muscle tone. She displays no seizure activity. Coordination and gait normal. GCS eye subscore is 4. GCS verbal subscore is 5. GCS motor subscore is 6.  On/off exam table; in/out of chair without difficulty; gait sure and steady in hallway  Skin: Skin is warm, dry and intact. Rash noted. No abrasion, no bruising, no burn, no ecchymosis,  no laceration, no lesion, no petechiae and no purpura noted. Rash is macular. Rash is not papular, not maculopapular, not nodular, not pustular, not vesicular and not urticarial. She is not diaphoretic. No cyanosis or erythema. No pallor. Nails show no clubbing.     Slight macular erythema at right lower lash line with localized nonpitting edema 0-1+/4  Psychiatric: She has a normal mood and affect. Her speech is normal and behavior is normal. Judgment and thought content normal. Cognition and memory are normal.  Nursing note and vitals reviewed.         Assessment & Plan:  A-dysuria and right conjunctival injury initial visit  P-Tylenol 1000mg  po QID prn pain.  Discussed probable mechanical irritation from sexual intercourse/slight dehydration from shoveling/working outside this weekend/am.  Discussed pyridium will make urine orange and can discolor clothes/cloth if it comes in contact. Patient is also to push fluids and may use Pyridium 200mg  po TID x 48 hoursas needed.  Electronic Rx sent to patient pharmacy of choice. Hydrate, avoid dehydration. Avoid holding urine void on frequent basis every 4 to 6 hours. If unable to void every 8 hours, recurrent vomiting follow up for re-evaluation with PCM, urgent care or ER. Follow up repeat urinalysis in 48 hours if no improvement or worsening in symptoms.  Call or return to clinic as needed if these symptoms worsen or fail to improve as anticipated. Exitcare handout on UTI given to patient Patient verbalized agreement and understanding of treatment plan and had no further questions at this time.  P2: Hydrate and cranberry juice   Patient to apply warm packs prn as directed. Instructed patient to not rub eyes. May use over the counter eye drops/tears for pain/symptom relief. Given 5 UD from clinic stock refresh 2 gtts ou TID prn pain x 14 days.  Patient has more at home and electronic Rx given for refill.  Erythromycin ophthalmic 1cm ribbon right eye QID  x 7 days #1 RF0 Electronic Rx to patient pharmacy of choice.  Return to clinic if headache, fever greater than 100.41F, nausea/vomiting, purulent discharge/matting unable to open eye without using fingers, foreign body sensation, ciliary flush, worsening photophobia or vision.  ER/optometry same day orbital swelling/red streaks/visual field loss/worst headache of life.   Call or return to clinic as needed if these symptoms worsen or fail to improve as anticipated. Patient given Exitcare handout on foreign body eye. Patient verbalized agreement and understanding of treatment plan and had no further questions at this time.  P2: Hand washing

## 2017-02-14 NOTE — Patient Instructions (Signed)
Urinary Tract Infection, Adult A urinary tract infection (UTI) is an infection of any part of the urinary tract, which includes the kidneys, ureters, bladder, and urethra. These organs make, store, and get rid of urine in the body. UTI can be a bladder infection (cystitis) or kidney infection (pyelonephritis). What are the causes? This infection may be caused by fungi, viruses, or bacteria. Bacteria are the most common cause of UTIs. This condition can also be caused by repeated incomplete emptying of the bladder during urination. What increases the risk? This condition is more likely to develop if:  You ignore your need to urinate or hold urine for long periods of time.  You do not empty your bladder completely during urination.  You wipe back to front after urinating or having a bowel movement, if you are female.  You are uncircumcised, if you are female.  You are constipated.  You have a urinary catheter that stays in place (indwelling).  You have a weak defense (immune) system.  You have a medical condition that affects your bowels, kidneys, or bladder.  You have diabetes.  You take antibiotic medicines frequently or for long periods of time, and the antibiotics no longer work well against certain types of infections (antibiotic resistance).  You take medicines that irritate your urinary tract.  You are exposed to chemicals that irritate your urinary tract.  You are female.  What are the signs or symptoms? Symptoms of this condition include:  Fever.  Frequent urination or passing small amounts of urine frequently.  Needing to urinate urgently.  Pain or burning with urination.  Urine that smells bad or unusual.  Cloudy urine.  Pain in the lower abdomen or back.  Trouble urinating.  Blood in the urine.  Vomiting or being less hungry than normal.  Diarrhea or abdominal pain.  Vaginal discharge, if you are female.  How is this diagnosed? This condition is  diagnosed with a medical history and physical exam. You will also need to provide a urine sample to test your urine. Other tests may be done, including:  Blood tests.  Sexually transmitted disease (STD) testing.  If you have had more than one UTI, a cystoscopy or imaging studies may be done to determine the cause of the infections. How is this treated? Treatment for this condition often includes a combination of two or more of the following:  Antibiotic medicine.  Other medicines to treat less common causes of UTI.  Over-the-counter medicines to treat pain.  Drinking enough water to stay hydrated.  Follow these instructions at home:  Take over-the-counter and prescription medicines only as told by your health care provider.  If you were prescribed an antibiotic, take it as told by your health care provider. Do not stop taking the antibiotic even if you start to feel better.  Avoid alcohol, caffeine, tea, and carbonated beverages. They can irritate your bladder.  Drink enough fluid to keep your urine clear or pale yellow.  Keep all follow-up visits as told by your health care provider. This is important.  Make sure to: ? Empty your bladder often and completely. Do not hold urine for long periods of time. ? Empty your bladder before and after sex. ? Wipe from front to back after a bowel movement if you are female. Use each tissue one time when you wipe. Contact a health care provider if:  You have back pain.  You have a fever.  You feel nauseous or vomit.  Your symptoms do not  get better after 3 days.  Your symptoms go away and then return. Get help right away if:  You have severe back pain or lower abdominal pain.  You are vomiting and cannot keep down any medicines or water. This information is not intended to replace advice given to you by your health care provider. Make sure you discuss any questions you have with your health care provider. Document Released:  12/01/2004 Document Revised: 08/05/2015 Document Reviewed: 01/12/2015 Elsevier Interactive Patient Education  2017 [employer] Eye Foreign Body A foreign body refers to any object on the surface of the eye or in the eyeball that should not be there. A foreign body may be a small speck of dirt or dust, a hair or eyelash, a splinter, or any other object. What are the signs or symptoms? Symptoms depend on what the foreign body is and where it is in the eye. The most common locations are:  On the inner surface of the upper or lower eyelids or on the covering of the white part of the eye (conjunctiva). Symptoms in this location are: ? Pain and irritation, especially when blinking. ? The feeling that something is in the eye.  On the surface of the clear covering on the front of the eye (cornea). Symptoms in this location include: ? Pain and irritation. ? Small "rust rings" around a metallic foreign body. ? The feeling that something is in the eye.  Inside the eyeball. Foreign bodies inside the eye may cause: ? Great pain. ? Immediate loss of vision. ? Distortion of the pupil.  How is this diagnosed? Foreign bodies are found during an exam by an eye specialist. Those on the eyelids, conjunctiva, or cornea are usually (but not always) easily found. When a foreign body is inside the eyeball, a cloudiness of the lens (cataract) may form almost right away. This makes it hard for an eye specialist to find the foreign body. Tests may be needed, including ultrasound testing, X-rays, and CT scans. How is this treated?  Foreign bodies on the eyelids, conjunctiva, or cornea are often removed easily and painlessly.  Rust in the cornea may require the use of a drill-like instrument to remove the rust.  If the foreign body has caused a scratch or a rubbing or scraping (abrasion) of the cornea, this may be treated with antibiotic drops or ointment. A pressure patch may be put over your eye.  If the  foreign body is inside your eyeball, surgery is needed right away. This is a medical emergency. Foreign bodies inside the eye threaten vision. A person may even lose his or her eye. Follow these instructions at home:  Take medicines only as directed by your health care provider. Use eye drops or ointment as directed.  If no eye patch was applied: ? Keep your eye closed as much as possible. ? Do not rub your eye. ? Wear dark glasses as needed to protect your eyes from bright light. ? Do not wear contact lenses until your eye feels normal again, or as instructed by your health care provider. ? Wear a protective eye covering if there is a risk of eye injury. This is important when working with high-speed tools.  If your eye is patched: ? Follow your health care provider's instructions for when to remove the patch. ? Do notdrive or operate machinery if your eye is patched. Your ability to judge distances is impaired.  Keep all follow-up visits as directed by your health care provider.  This is important. Contact a health care provider if:  You have increased pain in your eye.  Your vision gets worse.  You have problems with your eye patch.  You have fluid (discharge) coming from your injured eye.  You have redness and swelling around your affected eye. This information is not intended to replace advice given to you by your health care provider. Make sure you discuss any questions you have with your health care provider. Document Released: 02/21/2005 Document Revised: 07/30/2015 Document Reviewed: 07/19/2012 Elsevier Interactive Patient Education  2017 [employer]

## 2017-02-17 ENCOUNTER — Ambulatory Visit: Payer: Self-pay | Admitting: Registered Nurse

## 2017-02-17 VITALS — BP 116/77 | HR 87 | Temp 98.4°F | Resp 16

## 2017-02-17 DIAGNOSIS — S39012A Strain of muscle, fascia and tendon of lower back, initial encounter: Secondary | ICD-10-CM

## 2017-02-17 DIAGNOSIS — N3001 Acute cystitis with hematuria: Secondary | ICD-10-CM

## 2017-02-17 LAB — POCT URINALYSIS DIPSTICK
Bilirubin, UA: NEGATIVE
Glucose, UA: NEGATIVE
Ketones, UA: NEGATIVE
PH UA: 6 (ref 5.0–8.0)
PROTEIN UA: NEGATIVE
Spec Grav, UA: 1.02 (ref 1.010–1.025)
UROBILINOGEN UA: 0.2 U/dL

## 2017-02-17 MED ORDER — FLUCONAZOLE 150 MG PO TABS: ORAL_TABLET | ORAL | 0 refills | Status: DC

## 2017-02-17 MED ORDER — SULFAMETHOXAZOLE-TRIMETHOPRIM 800-160 MG PO TABS: 1.0000 | ORAL_TABLET | Freq: Two times a day (BID) | ORAL | 0 refills | Status: AC

## 2017-02-17 NOTE — Patient Instructions (Signed)
Hematuria, Adult Hematuria is blood in your urine. It can be caused by a bladder infection, kidney infection, prostate infection, kidney stone, or cancer of your urinary tract. Infections can usually be treated with medicine, and a kidney stone usually will pass through your urine. If neither of these is the cause of your hematuria, further workup to find out the reason may be needed. It is very important that you tell your health care provider about any blood you see in your urine, even if the blood stops without treatment or happens without causing pain. Blood in your urine that happens and then stops and then happens again can be a symptom of a very serious condition. Also, pain is not a symptom in the initial stages of many urinary cancers. Follow these instructions at home:  Drink lots of fluid, 3-4 quarts a day. If you have been diagnosed with an infection, cranberry juice is especially recommended, in addition to large amounts of water.  Avoid caffeine, tea, and carbonated beverages because they tend to irritate the bladder.  Avoid alcohol because it may irritate the prostate.  Take all medicines as directed by your health care provider.  If you were prescribed an antibiotic medicine, finish it all even if you start to feel better.  If you have been diagnosed with a kidney stone, follow your health care provider's instructions regarding straining your urine to catch the stone.  Empty your bladder often. Avoid holding urine for long periods of time.  After a bowel movement, women should cleanse front to back. Use each tissue only once.  Empty your bladder before and after sexual intercourse if you are a female. Contact a health care provider if:  You develop back pain.  You have a fever.  You have a feeling of sickness in your stomach (nausea) or vomiting.  Your symptoms are not better in 3 days. Return sooner if you are getting worse. Get help right away if:  You develop  severe vomiting and are unable to keep the medicine down.  You develop severe back or abdominal pain despite taking your medicines.  You begin passing a large amount of blood or clots in your urine.  You feel extremely weak or faint, or you pass out. This information is not intended to replace advice given to you by your health care provider. Make sure you discuss any questions you have with your health care provider. Document Released: 02/21/2005 Document Revised: 07/30/2015 Document Reviewed: 10/22/2012 Elsevier Interactive Patient Education  2017 [employer] Urinary Tract Infection, Adult A urinary tract infection (UTI) is an infection of any part of the urinary tract. The urinary tract includes the:  Kidneys.  Ureters.  Bladder.  Urethra.  These organs make, store, and get rid of pee (urine) in the body. Follow these instructions at home:  Take over-the-counter and prescription medicines only as told by your doctor.  If you were prescribed an antibiotic medicine, take it as told by your doctor. Do not stop taking the antibiotic even if you start to feel better.  Avoid the following drinks: ? Alcohol. ? Caffeine. ? Tea. ? Carbonated drinks.  Drink enough fluid to keep your pee clear or pale yellow.  Keep all follow-up visits as told by your doctor. This is important.  Make sure to: ? Empty your bladder often and completely. Do not to hold pee for long periods of time. ? Empty your bladder before and after sex. ? Wipe from front to back after a bowel   movement if you are female. Use each tissue one time when you wipe. Contact a doctor if:  You have back pain.  You have a fever.  You feel sick to your stomach (nauseous).  You throw up (vomit).  Your symptoms do not get better after 3 days.  Your symptoms go away and then come back. Get help right away if:  You have very bad back pain.  You have very bad lower belly (abdominal) pain.  You are throwing up  and cannot keep down any medicines or water. This information is not intended to replace advice given to you by your health care provider. Make sure you discuss any questions you have with your health care provider. Document Released: 08/10/2007 Document Revised: 07/30/2015 Document Reviewed: 01/12/2015 Elsevier Interactive Patient Education  2018 [employer] Inc[employer]  

## 2017-02-17 NOTE — Progress Notes (Signed)
Subjective:    Patient ID: Sydney Spencer, female    DOB: Jan 17, 1976, 41 y.o.   MRN: [id number]  41y/o caucasian female established patient took last pyridium this morning still having urinary urgency/frequency/decreased urine and some back pain but has been shoveling snow this week daily.  Denied fever/chills/nausea/vomiting/hematuria/flank pain.  Last seen 14 Feb 2017.  End urination discomfort has decreased a little but has not resolved with pyridium and if she presses on suprapubic area still has sensation she needs to urinate even if she just emptied bladder.  Sometimes her eye still feels scratchy but feeling much better with prescribed eye ointment and drops.  Blurred vision resolved.  Denied loss of bowel/bladder control, saddle paresthesias, arm/leg weakness with back pain.      Review of Systems  Constitutional: Negative for activity change, appetite change, chills, diaphoresis, fatigue and fever.  HENT: Negative for congestion, trouble swallowing and voice change.   Eyes: Negative for photophobia, pain, discharge, redness, itching and visual disturbance.  Respiratory: Negative for cough.   Cardiovascular: Negative for leg swelling.  Gastrointestinal: Negative for abdominal distention, abdominal pain, blood in stool, constipation, diarrhea, nausea and vomiting.  Endocrine: Negative for cold intolerance and heat intolerance.  Genitourinary: Positive for decreased urine volume, dysuria, frequency and urgency. Negative for difficulty urinating, dyspareunia, enuresis, flank pain, genital sores, hematuria, menstrual problem, pelvic pain, vaginal bleeding, vaginal discharge and vaginal pain.  Musculoskeletal: Positive for back pain and myalgias. Negative for arthralgias, gait problem, joint swelling, neck pain and neck stiffness.  Skin: Negative for color change, pallor, rash and wound.  Allergic/Immunologic: Positive for environmental allergies. Negative for food allergies.   Neurological: Negative for dizziness, tremors, seizures, syncope, facial asymmetry, speech difficulty, weakness, light-headedness, numbness and headaches.  Hematological: Negative for adenopathy. Does not bruise/bleed easily.  Psychiatric/Behavioral: Negative for agitation, confusion and sleep disturbance.       Objective:   Physical Exam  Constitutional: She is oriented to person, place, and time. Vital signs are normal. She appears well-developed and well-nourished. She is active and cooperative.  Non-toxic appearance. She does not have a sickly appearance. She does not appear ill. No distress.  HENT:  Head: Normocephalic and atraumatic.  Right Ear: Hearing and external ear normal.  Left Ear: Hearing and external ear normal.  Nose: Nose normal.  Mouth/Throat: Uvula is midline, oropharynx is clear and moist and mucous membranes are normal. No oropharyngeal exudate.  Eyes: Conjunctivae, EOM and lids are normal. Pupils are equal, round, and reactive to light. Right eye exhibits no chemosis, no discharge, no exudate and no hordeolum. No foreign body present in the right eye. Left eye exhibits no chemosis, no discharge, no exudate and no hordeolum. No foreign body present in the left eye. No scleral icterus.  Neck: Trachea normal, normal range of motion and phonation normal. Neck supple. No muscular tenderness present. No neck rigidity. No tracheal deviation, no edema, no erythema and normal range of motion present.  Cardiovascular: Normal rate, regular rhythm, normal heart sounds and intact distal pulses.  Pulmonary/Chest: Effort normal and breath sounds normal. No stridor. No respiratory distress. She has no decreased breath sounds. She has no wheezes. She has no rhonchi. She has no rales.  Abdominal: Soft. Normal appearance and bowel sounds are normal. She exhibits no shifting dullness, no distension, no pulsatile liver, no fluid wave, no abdominal bruit, no ascites, no pulsatile midline mass  and no mass. There is no hepatosplenomegaly. There is no tenderness. There is no rigidity, no rebound,  no guarding, no CVA tenderness, no tenderness at McBurney's point and negative Murphy's sign. Hernia confirmed negative in the ventral area.  Dull to percussion x 4 quads; normoactive bowel sounds x 4 quads  Musculoskeletal: Normal range of motion. She exhibits no edema, tenderness or deformity.       Right shoulder: Normal.       Left shoulder: Normal.       Right elbow: Normal.      Left elbow: Normal.       Right hip: Normal.       Left hip: Normal.       Right knee: Normal.       Left knee: Normal.       Cervical back: Normal.       Thoracic back: Normal.       Lumbar back: She exhibits pain. She exhibits normal range of motion, no tenderness, no bony tenderness, no swelling, no edema, no deformity, no laceration and no spasm.       Back:       Right hand: Normal.       Left hand: Normal.  Able to touch toes, normal heel toe gait in hall; on/off exam table without difficulty  Lymphadenopathy:    She has no cervical adenopathy.       Right cervical: No superficial cervical adenopathy present.      Left cervical: No superficial cervical adenopathy present.  Neurological: She is alert and oriented to person, place, and time. She has normal strength. She is not disoriented. She displays no atrophy and no tremor. No cranial nerve deficit or sensory deficit. She exhibits normal muscle tone. She displays no seizure activity. Coordination and gait normal. GCS eye subscore is 4. GCS verbal subscore is 5. GCS motor subscore is 6.  In/out of chair/on/off exam table without difficulty; gait sure and steady in hallway  Skin: Skin is warm, dry and intact. No abrasion, no bruising, no burn, no ecchymosis, no laceration, no lesion, no petechiae and no rash noted. She is not diaphoretic. No cyanosis or erythema. No pallor. Nails show no clubbing.  Psychiatric: She has a normal mood and affect. Her  speech is normal and behavior is normal. Judgment and thought content normal. Cognition and memory are normal.          Assessment & Plan:  A-UTI, hematuria, low back strain  P-motrin 800mg  po TID prn pain OTC.  Bactrim DS po BID x 3 days #40 RF0 dispensed from [facility].  Patient typically gets vaginal yeast infection with oral antibiotics prefers diflucan oral Electronic Rx diflucan 150mg  po x 1 onset symptoms and may repeat in 72 hours #2 Rf0 to [employer] [facility] of choice.  Discussed eating yogurt with active cultures while on antibiotics.  Medications as directed.  Patient is also to push fluids  Hydrate, avoid dehydration.  Avoid holding urine void on frequent basis every 4 to 6 hours.  If unable to void every 8 hours follow up for re-evaluation with PCM, urgent care or ER.   Call or return to clinic as needed if these symptoms worsen or fail to improve as anticipated.  Will follow up next week for repeat urinalysis with [facility]M or [facility] clinic to ensure hematuria resolved.  Exitcare handout on UTi given to patient Patient verbalized agreement and understanding of treatment plan and had no further questions at this time. P2:  Hydrate and cranberry juice    Home stretches demonstrated to patient-e.g. Arm circles, walking up wall,  chest stretches, neck AROM, chin tucks, knee to chest and rock side to side on back. Self massage or professional prn, foam roller use or tennis/racquetball.  Heat/cryotherapy 15 minutes QID prn.  Consider physical therapy referral if no improvement with [facility] and/or chiropractic care.  Ensure ergonomics correct at work avoid repetitive motions if possible/holding phone/laptop in hand use desk/stand and/or break up lifting items into smaller loads/weights.  Patient was instructed to rest, ice, and ROM exercises.  Activity as tolerated.   Follow up if symptoms persist or worsen especially if loss of bowel/bladder control, arm/leg weakness and/or saddle  paresthesias. Patient verbalized agreement and understanding of treatment plan and had no further questions at this time.  P2:  Injury Prevention and Fitness.

## 2017-03-14 ENCOUNTER — Ambulatory Visit: Payer: Self-pay | Admitting: Registered Nurse

## 2017-03-14 VITALS — BP 115/80 | HR 84

## 2017-03-14 DIAGNOSIS — J019 Acute sinusitis, unspecified: Secondary | ICD-10-CM

## 2017-03-14 DIAGNOSIS — H00012 Hordeolum externum right lower eyelid: Secondary | ICD-10-CM

## 2017-03-14 DIAGNOSIS — S0502XD Injury of conjunctiva and corneal abrasion without foreign body, left eye, subsequent encounter: Secondary | ICD-10-CM

## 2017-03-14 DIAGNOSIS — H65192 Other acute nonsuppurative otitis media, left ear: Secondary | ICD-10-CM

## 2017-03-14 MED ORDER — AMOXICILLIN-POT CLAVULANATE 875-125 MG PO TABS: 1.0000 | ORAL_TABLET | Freq: Two times a day (BID) | ORAL | 0 refills | Status: DC

## 2017-03-14 MED ORDER — SALINE SPRAY 0.65 % NA SOLN: 2.0000 | NASAL | 0 refills | Status: DC

## 2017-03-14 MED ORDER — ACETAMINOPHEN 500 MG PO TABS: 1000.0000 mg | ORAL_TABLET | Freq: Four times a day (QID) | ORAL | 0 refills | Status: AC | PRN

## 2017-03-14 MED ORDER — CARBOXYMETHYLCELLULOSE SODIUM 1 % OP SOLN: 1.0000 [drp] | Freq: Three times a day (TID) | OPHTHALMIC | 0 refills | Status: AC

## 2017-03-14 NOTE — Progress Notes (Signed)
Subjective:    Patient ID: Sydney Spencer, female    DOB: 10-13-1975, 42 y.o.   MRN: [id number]  41y/o caucasian female established Pt c/o foreign body sensation to R eye. Thinks something may have gotten in her eye yesterday, possibly drywall. Using lubricant drops since yesterday. Still with irritation to eye.   Also c/o sinus congestion and pressure, frontal HA, bil otalgia R>L, and productive cough. Has been doing Human resources officerdrywall work/facilities maintanence.      Review of Systems  Constitutional: Positive for fatigue. Negative for activity change, appetite change, chills, diaphoresis, fever and unexpected weight change.  HENT: Positive for congestion, ear pain, postnasal drip, rhinorrhea, sinus pressure and sinus pain. Negative for dental problem, drooling, ear discharge, facial swelling, hearing loss, mouth sores, nosebleeds, sneezing, sore throat, tinnitus, trouble swallowing and voice change.   Eyes: Positive for pain and redness. Negative for photophobia, discharge, itching and visual disturbance.  Respiratory: Positive for cough. Negative for choking, chest tightness, shortness of breath, wheezing and stridor.   Cardiovascular: Negative for chest pain, palpitations and leg swelling.  Gastrointestinal: Negative for abdominal distention, abdominal pain, blood in stool, constipation, diarrhea, nausea and vomiting.  Endocrine: Negative for cold intolerance and heat intolerance.  Genitourinary: Negative for difficulty urinating, dysuria and hematuria.  Musculoskeletal: Negative for arthralgias, back pain, gait problem, joint swelling, myalgias, neck pain and neck stiffness.  Skin: Negative for color change, pallor, rash and wound.  Allergic/Immunologic: Positive for environmental allergies. Negative for food allergies.  Neurological: Positive for headaches. Negative for dizziness, tremors, seizures, syncope, facial asymmetry, speech difficulty, weakness, light-headedness and numbness.   Hematological: Negative for adenopathy. Does not bruise/bleed easily.  Psychiatric/Behavioral: Negative for agitation, behavioral problems, confusion and sleep disturbance.       Objective:   Physical Exam  Constitutional: She is oriented to person, place, and time. She appears well-developed and well-nourished. She is active and cooperative.  Non-toxic appearance. She does not have a sickly appearance. She appears ill. No distress.  HENT:  Head: Normocephalic and atraumatic.  Right Ear: Hearing, external ear and ear canal normal. A middle ear effusion is present.  Left Ear: Hearing, external ear and ear canal normal. Tympanic membrane is injected, erythematous and bulging. A middle ear effusion is present.  Nose: Mucosal edema and rhinorrhea present. No nose lacerations, sinus tenderness, nasal deformity, septal deviation or nasal septal hematoma. No epistaxis.  No foreign bodies. Right sinus exhibits frontal sinus tenderness. Right sinus exhibits no maxillary sinus tenderness. Left sinus exhibits frontal sinus tenderness. Left sinus exhibits no maxillary sinus tenderness.  Mouth/Throat: Uvula is midline and mucous membranes are normal. Mucous membranes are not pale, not dry and not cyanotic. She does not have dentures. Oral lesions present. No trismus in the jaw. Normal dentition. No dental abscesses, uvula swelling, lacerations or dental caries. Posterior oropharyngeal edema and posterior oropharyngeal erythema present. No oropharyngeal exudate or tonsillar abscesses.  Oropharyngeal maculopapular rash/erythema; cobblestoning posterior pharynx; bilateral TMs air fluid level clear; bilateral allergic shiners; bilateral nasal turbinates edema/erythema/clear discharge/ frequent throat clearing and nose sniffing; left TM erythema bulging vasculature inflamed  Eyes: EOM and lids are normal. Pupils are equal, round, and reactive to light. Right eye exhibits hordeolum. Right eye exhibits no chemosis, no  discharge and no exudate. No foreign body present in the right eye. Left eye exhibits no chemosis, no discharge, no exudate and no hordeolum. No foreign body present in the left eye. Right conjunctiva is injected. Right conjunctiva has no hemorrhage. Left conjunctiva is  injected. Left conjunctiva has no hemorrhage. No scleral icterus. Right eye exhibits normal extraocular motion and no nystagmus. Left eye exhibits normal extraocular motion and no nystagmus. Right pupil is round and reactive. Left pupil is round and reactive. Pupils are equal.    Fluorescein stain uptake medial and lateral pupil bulbar conjunctiva as marked; lower eyelids bilaterally injection 2-3+/4; right lower eyelid centrally 1-70mm hordeolum at lash line  Neck: Trachea normal and normal range of motion. Neck supple. No tracheal tenderness, no spinous process tenderness and no muscular tenderness present. No neck rigidity. No tracheal deviation, no edema, no erythema and normal range of motion present. No thyroid mass and no thyromegaly present.  Cardiovascular: Normal rate, regular rhythm, S1 normal, S2 normal, normal heart sounds and intact distal pulses. PMI is not displaced. Exam reveals no gallop and no friction rub.  No murmur heard. Pulmonary/Chest: Effort normal and breath sounds normal. No accessory muscle usage or stridor. No respiratory distress. She has no decreased breath sounds. She has no wheezes. She has no rhonchi. She has no rales. She exhibits no tenderness.  Abdominal: Soft. She exhibits no distension.  Musculoskeletal: Normal range of motion. She exhibits no edema or tenderness.       Right shoulder: Normal.       Left shoulder: Normal.       Right hip: Normal.       Left hip: Normal.       Right knee: Normal.       Left knee: Normal.       Cervical back: Normal.       Right hand: Normal.       Left hand: Normal.  Lymphadenopathy:       Head (right side): No submental, no submandibular, no tonsillar, no  preauricular, no posterior auricular and no occipital adenopathy present.       Head (left side): No submental, no submandibular, no tonsillar, no preauricular, no posterior auricular and no occipital adenopathy present.    She has no cervical adenopathy.       Right cervical: No superficial cervical, no deep cervical and no posterior cervical adenopathy present.      Left cervical: No superficial cervical, no deep cervical and no posterior cervical adenopathy present.  Neurological: She is alert and oriented to person, place, and time. She has normal strength. She is not disoriented. She displays no atrophy and no tremor. No cranial nerve deficit or sensory deficit. She exhibits normal muscle tone. She displays no seizure activity. Coordination and gait normal. GCS eye subscore is 4. GCS verbal subscore is 5. GCS motor subscore is 6.  Skin: Skin is warm, dry and intact. No abrasion, no bruising, no burn, no ecchymosis, no laceration, no lesion, no petechiae and no rash noted. She is not diaphoretic. No cyanosis or erythema. No pallor. Nails show no clubbing.  Psychiatric: She has a normal mood and affect. Her speech is normal and behavior is normal. Judgment and thought content normal. Cognition and memory are normal.  Nursing note and vitals reviewed.         Assessment & Plan:  A-conjunctival abrasion left; left otitis media aucte nonsupportive, acute rhinosinusitis  P-Restart flonase 1 spray each nostril BID, saline 2 sprays each nostril q2h wa prn congestion 1 bottle given to patient from clinic stock.  If no improvement with 48 hours of saline and flonase use start augmentin 875mg  po BID x 10 days #20 RF0 dispensed from [facility]  Denied personal or family  history of ENT cancer.  Shower BID especially prior to bed. No evidence of systemic bacterial infection, non toxic and well hydrated.  I do not see where any further testing or imaging is necessary at this time.   I will suggest supportive care,  rest, good hygiene and encourage the patient to take adequate fluids.  The patient is to return to clinic or EMERGENCY ROOM if symptoms worsen or change significantly.  Exitcare handout on sinusitis and sinus rinse given to patient.  Patient verbalized agreement and understanding of treatment plan and had no further questions at this time.   P2:  Hand washing and cover cough  Augmentin 875mg  po BID x 10 days #20 RF0 dispensed from [facility] to patient.  Tylenol 1000mg  po QID prn pain #6 UD given to patient from clinic stock.  Supportive treatment.   No evidence of invasive bacterial infection, non toxic and well hydrated.  This is most likely self limiting viral infection.  I do not see where any further testing or imaging is necessary at this time.   I will suggest supportive care, rest, good hygiene and encourage the patient to take adequate fluids.  The patient is to return to clinic or EMERGENCY ROOM if symptoms worsen or change significantly e.g. ear pain, fever, purulent discharge from ears or bleeding.  Exitcare handout on otitis media given to patient.  Patient verbalized agreement and understanding of treatment plan.    Hygiene discussed. Do not wear eye makeup.  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 Patient to apply warm packs prn as directed. Instructed patient to not rub eyes. May need to wash pillowcases more frequently until infection resolves. May use over the counter eye drops/tears for pain/symptom relief e.g. Refresh 2 gtts ou TID prn redness. Return to clinic if headache, fever greater than 100.68F, nausea/vomiting, purulent discharge/matting unable to open eye without using fingers, foreign body sensation, ciliary flush, worsening photophobia or vision. Call or return to clinic as needed if these symptoms worsen or fail to improve as anticipated. Patient given Exitcare handout on conjunctivitis. Patient verbalized agreement and understanding of treatment plan.   P2: Hand washing, avoid contact use-wear glasses.

## 2017-03-14 NOTE — Patient Instructions (Signed)
Stye A stye is a bump on your eyelid caused by a bacterial infection. A stye can form inside the eyelid (internal stye) or outside the eyelid (external stye). An internal stye may be caused by an infected oil-producing gland inside your eyelid. An external stye may be caused by an infection at the base of your eyelash (hair follicle). Styes are very common. Anyone can get them at any age. They usually occur in just one eye, but you may have more than one in either eye. What are the causes? The infection is almost always caused by bacteria called Staphylococcus aureus. This is a common type of bacteria that lives on your skin. What increases the risk? You may be at higher risk for a stye if you have had one before. You may also be at higher risk if you have:  Diabetes.  Long-term illness.  Long-term eye redness.  A skin condition called seborrhea.  High fat levels in your blood (lipids).  What are the signs or symptoms? Eyelid pain is the most common symptom of a stye. Internal styes are more painful than external styes. Other signs and symptoms may include:  Painful swelling of your eyelid.  A scratchy feeling in your eye.  Tearing and redness of your eye.  Pus draining from the stye.  How is this diagnosed? Your health care provider may be able to diagnose a stye just by examining your eye. The health care provider may also check to make sure:  You do not have a fever or other signs of a more serious infection.  The infection has not spread to other parts of your eye or areas around your eye.  How is this treated? Most styes will clear up in a few days without treatment. In some cases, you may need to use antibiotic drops or ointment to prevent infection. Your health care provider may have to drain the stye surgically if your stye is:  Large.  Causing a lot of pain.  Interfering with your vision.  This can be done using a thin blade or a needle. Follow these  instructions at home:  Take medicines only as directed by your health care provider.  Apply a clean, warm compress to your eye for 10 minutes, 4 times a day.  Do not wear contact lenses or eye makeup until your stye has healed.  Do not try to pop or drain the stye. Contact a health care provider if:  You have chills or a fever.  Your stye does not go away after several days.  Your stye affects your vision.  Your eyeball becomes swollen, red, or painful. This information is not intended to replace advice given to you by your health care provider. Make sure you discuss any questions you have with your health care provider. Document Released: 12/01/2004 Document Revised: 10/18/2015 Document Reviewed: 06/07/2013 Elsevier Interactive Patient Education  2018 [employer]. Eye Foreign Body A foreign body refers to any object on the surface of the eye or in the eyeball that should not be there. A foreign body may be a small speck of dirt or dust, a hair or eyelash, a splinter, or any other object. What are the signs or symptoms? Symptoms depend on what the foreign body is and where it is in the eye. The most common locations are:  On the inner surface of the upper or lower eyelids or on the covering of the white part of the eye (conjunctiva). Symptoms in this location are: ?  Pain and irritation, especially when blinking. ? The feeling that something is in the eye.  On the surface of the clear covering on the front of the eye (cornea). Symptoms in this location include: ? Pain and irritation. ? Small "rust rings" around a metallic foreign body. ? The feeling that something is in the eye.  Inside the eyeball. Foreign bodies inside the eye may cause: ? Great pain. ? Immediate loss of vision. ? Distortion of the pupil.  How is this diagnosed? Foreign bodies are found during an exam by an eye specialist. Those on the eyelids, conjunctiva, or cornea are usually (but not always) easily  found. When a foreign body is inside the eyeball, a cloudiness of the lens (cataract) may form almost right away. This makes it hard for an eye specialist to find the foreign body. Tests may be needed, including ultrasound testing, X-rays, and CT scans. How is this treated?  Foreign bodies on the eyelids, conjunctiva, or cornea are often removed easily and painlessly.  Rust in the cornea may require the use of a drill-like instrument to remove the rust.  If the foreign body has caused a scratch or a rubbing or scraping (abrasion) of the cornea, this may be treated with antibiotic drops or ointment. A pressure patch may be put over your eye.  If the foreign body is inside your eyeball, surgery is needed right away. This is a medical emergency. Foreign bodies inside the eye threaten vision. A person may even lose his or her eye. Follow these instructions at home:  Take medicines only as directed by your health care provider. Use eye drops or ointment as directed.  If no eye patch was applied: ? Keep your eye closed as much as possible. ? Do not rub your eye. ? Wear dark glasses as needed to protect your eyes from bright light. ? Do not wear contact lenses until your eye feels normal again, or as instructed by your health care provider. ? Wear a protective eye covering if there is a risk of eye injury. This is important when working with high-speed tools.  If your eye is patched: ? Follow your health care provider's instructions for when to remove the patch. ? Do notdrive or operate machinery if your eye is patched. Your ability to judge distances is impaired.  Keep all follow-up visits as directed by your health care provider. This is important. Contact a health care provider if:  You have increased pain in your eye.  Your vision gets worse.  You have problems with your eye patch.  You have fluid (discharge) coming from your injured eye.  You have redness and swelling around your  affected eye. This information is not intended to replace advice given to you by your health care provider. Make sure you discuss any questions you have with your health care provider. Document Released: 02/21/2005 Document Revised: 07/30/2015 Document Reviewed: 07/19/2012 Elsevier Interactive Patient Education  2017 [employer] Nonallergic Rhinitis Nonallergic rhinitis is a condition that causes symptoms that affect the nose, such as a runny nose and a stuffed-up nose (nasal congestion) that can make it hard to breathe through the nose. This condition is different from having an allergy (allergic rhinitis). Allergic rhinitis occurs when the body's defense system (immune system) reacts to a substance that you are allergic to (allergen), such as pollen, pet dander, mold, or dust. Nonallergic rhinitis has many similar symptoms, but it is not caused by allergens. Nonallergic rhinitis can be a short-term  or long-term problem. What are the causes? This condition can be caused by many different things. Some common types of nonallergic rhinitis include: Infectious rhinitis  This is usually due to an infection in the upper respiratory tract. Vasomotor rhinitis  This is the most common type of long-term nonallergic rhinitis.  It is caused by too much blood flow through the nose, which makes the tissue inside of the nose swell.  Symptoms are often triggered by strong odors, cold air, stress, drinking alcohol, cigarette smoke, or changes in the weather. Occupational rhinitis  This type is caused by triggers in the workplace, such as chemicals, dusts, animal dander, or air pollution. Hormonal rhinitis  This type occurs in women as a result of an increase in the female hormone estrogen.  It may occur during pregnancy, puberty, and menstrual cycles.  Symptoms improve when estrogen levels drop. Drug-induced rhinitis Several drugs can cause nonallergic rhinitis, including:  Medicines that are used  to treat high blood pressure, heart disease, and Parkinson disease.  Aspirin and NSAIDs.  Over-the-counter nasal decongestant sprays. These can cause a type of nonallergic rhinitis (rhinitis medicamentosa) when they are used for more than a few days.  Nonallergic rhinitis with eosinophilia syndrome (NARES)  This type is caused by having too much of a certain type of white blood cell (eosinophil). Nonallergic rhinitis can also be caused by a reaction to eating hot or spicy foods. This does not usually cause long-term symptoms. In some cases, the cause of nonallergic rhinitis is not known. What increases the risk? You are more likely to develop this condition if:  You are 6030-44 years of age.  You are a woman. Women are twice as likely to have this condition.  What are the signs or symptoms? Common symptoms of this condition include:  Nasal congestion.  Runny nose.  The feeling of mucus going down the back of the throat (postnasal drip).  Trouble sleeping at night and daytime sleepiness.  Less common symptoms include:  Sneezing.  Coughing.  Itchy nose.  Bloodshot eyes.  How is this diagnosed? This condition may be diagnosed based on:  Your symptoms and medical history.  A physical exam.  Allergy testing to rule out allergic rhinitis. You may have skin tests or blood tests.  In some cases, the health care provider may take a swab of nasal secretions to look for an increased number of eosinophils. This would be done to confirm a diagnosis of NARES. How is this treated? Treatment for this condition depends on the cause. No single treatment works for everyone. Work with your health care provider to find the best treatment for you. Treatment may include:  Avoiding the things that trigger your symptoms.  Using medicines to relieve congestion, such as: ? Steroid nasal spray. There are many types. You may need to try a few to find out which one works best. ? Decongestant  medicine. This may be an oral medicine or a nasal spray. These medicines are only used for a short time.  Using medicines to relieve a runny nose. These may include antihistamine medicines or anticholinergic nasal sprays.  Surgery to remove tissue from inside the nose may be needed in severe cases if the condition has not improved after 6-12 months of medical treatment. Follow these instructions at home:  Take or use over-the-counter and prescription medicines only as told by your health care provider. Do not stop using your medicine even if you start to feel better.  Use salt-water (saline) rinses  or other solutions (nasal washes or irrigations) to wash or rinse out the inside of your nose as told by your health care provider.  Do not take NSAIDs or medicines that contain aspirin if they make your symptoms worse.  Do not drink alcohol if it makes your symptoms worse.  Do not use any tobacco products, such as cigarettes, chewing tobacco, and e-cigarettes. If you need help quitting, ask your health care provider.  Avoid secondhand smoke.  Get some exercise every day. Exercise may help reduce symptoms of nonallergic rhinitis for some people. Ask your health care provider how much exercise and what types of exercise are safe for you.  Sleep with the head of your bed raised (elevated). This may reduce nighttime nasal congestion.  Keep all follow-up visits as told by your health care provider. This is important. Contact a health care provider if:  You have a fever.  Your symptoms are getting worse at home.  Your symptoms are not responding to medicine.  You develop new symptoms, especially a headache or nosebleed. This information is not intended to replace advice given to you by your health care provider. Make sure you discuss any questions you have with your health care provider. Document Released: 06/15/2015 Document Revised: 07/30/2015 Document Reviewed: 05/14/2015 Elsevier  Interactive Patient Education  2018 [employer]. Sinusitis, Adult Sinusitis is soreness and inflammation of your sinuses. Sinuses are hollow spaces in the bones around your face. Your sinuses are located:  Around your eyes.  In the middle of your forehead.  Behind your nose.  In your cheekbones.  Your sinuses and nasal passages are lined with a stringy fluid (mucus). Mucus normally drains out of your sinuses. When your nasal tissues become inflamed or swollen, the mucus can become trapped or blocked so air cannot flow through your sinuses. This allows bacteria, viruses, and funguses to grow, which leads to infection. Sinusitis can develop quickly and last for 7?10 days (acute) or for more than 12 weeks (chronic). Sinusitis often develops after a cold. What are the causes? This condition is caused by anything that creates swelling in the sinuses or stops mucus from draining, including:  Allergies.  Asthma.  Bacterial or viral infection.  Abnormally shaped bones between the nasal passages.  Nasal growths that contain mucus (nasal polyps).  Narrow sinus openings.  Pollutants, such as chemicals or irritants in the air.  A foreign object stuck in the nose.  A fungal infection. This is rare.  What increases the risk? The following factors may make you more likely to develop this condition:  Having allergies or asthma.  Having had a recent cold or respiratory tract infection.  Having structural deformities or blockages in your nose or sinuses.  Having a weak immune system.  Doing a lot of swimming or diving.  Overusing nasal sprays.  Smoking.  What are the signs or symptoms? The main symptoms of this condition are pain and a feeling of pressure around the affected sinuses. Other symptoms include:  Upper toothache.  Earache.  Headache.  Bad breath.  Decreased sense of smell and taste.  A cough that may get worse at night.  Fatigue.  Fever.  Thick  drainage from your nose. The drainage is often green and it may contain pus (purulent).  Stuffy nose or congestion.  Postnasal drip. This is when extra mucus collects in the throat or back of the nose.  Swelling and warmth over the affected sinuses.  Sore throat.  Sensitivity to light.  How is this diagnosed? This condition is diagnosed based on symptoms, a medical history, and a physical exam. To find out if your condition is acute or chronic, your health care provider may:  Look in your nose for signs of nasal polyps.  Tap over the affected sinus to check for signs of infection.  View the inside of your sinuses using an imaging device that has a light attached (endoscope).  If your health care provider suspects that you have chronic sinusitis, you may also:  Be tested for allergies.  Have a sample of mucus taken from your nose (nasal culture) and checked for bacteria.  Have a mucus sample examined to see if your sinusitis is related to an allergy.  If your sinusitis does not respond to treatment and it lasts longer than 8 weeks, you may have an MRI or CT scan to check your sinuses. These scans also help to determine how severe your infection is. In rare cases, a bone biopsy may be done to rule out more serious types of fungal sinus disease. How is this treated? Treatment for sinusitis depends on the cause and whether your condition is chronic or acute. If a virus is causing your sinusitis, your symptoms will go away on their own within 10 days. You may be given medicines to relieve your symptoms, including:  Topical nasal decongestants. They shrink swollen nasal passages and let mucus drain from your sinuses.  Antihistamines. These drugs block inflammation that is triggered by allergies. This can help to ease swelling in your nose and sinuses.  Topical nasal corticosteroids. These are nasal sprays that ease inflammation and swelling in your nose and sinuses.  Nasal saline  washes. These rinses can help to get rid of thick mucus in your nose.  If your condition is caused by bacteria, you will be given an antibiotic medicine. If your condition is caused by a fungus, you will be given an antifungal medicine. Surgery may be needed to correct underlying conditions, such as narrow nasal passages. Surgery may also be needed to remove polyps. Follow these instructions at home: Medicines  Take, use, or apply over-the-counter and prescription medicines only as told by your health care provider. These may include nasal sprays.  If you were prescribed an antibiotic medicine, take it as told by your health care provider. Do not stop taking the antibiotic even if you start to feel better. Hydrate and Humidify  Drink enough water to keep your urine clear or pale yellow. Staying hydrated will help to thin your mucus.  Use a cool mist humidifier to keep the humidity level in your home above 50%.  Inhale steam for 10-15 minutes, 3-4 times a day or as told by your health care provider. You can do this in the bathroom while a hot shower is running.  Limit your exposure to cool or dry air. Rest  Rest as much as possible.  Sleep with your head raised (elevated).  Make sure to get enough sleep each night. General instructions  Apply a warm, moist washcloth to your face 3-4 times a day or as told by your health care provider. This will help with discomfort.  Wash your hands often with soap and water to reduce your exposure to viruses and other germs. If soap and water are not available, use hand sanitizer.  Do not smoke. Avoid being around people who are smoking (secondhand smoke).  Keep all follow-up visits as told by your health care provider. This is important. Contact a  health care provider if:  You have a fever.  Your symptoms get worse.  Your symptoms do not improve within 10 days. Get help right away if:  You have a severe headache.  You have persistent  vomiting.  You have pain or swelling around your face or eyes.  You have vision problems.  You develop confusion.  Your neck is stiff.  You have trouble breathing. This information is not intended to replace advice given to you by your health care provider. Make sure you discuss any questions you have with your health care provider. Document Released: 02/21/2005 Document Revised: 10/18/2015 Document Reviewed: 12/17/2014 Elsevier Interactive Patient Education  2018 [employer] Sinus Rinse What is a sinus rinse? A sinus rinse is a simple home treatment that is used to rinse your sinuses with a sterile mixture of salt and water (saline solution). Sinuses are air-filled spaces in your skull behind the bones of your face and forehead that open into your nasal cavity. You will use the following:  Saline solution.  Neti pot or spray bottle. This releases the saline solution into your nose and through your sinuses. Neti pots and spray bottles can be purchased at [employer], a health food store, or online.  When would I do a sinus rinse? A sinus rinse can help to clear mucus, dirt, dust, or pollen from the nasal cavity. You may do a sinus rinse when you have a cold, a virus, nasal allergy symptoms, a sinus infection, or stuffiness in the nose or sinuses. If you are considering a sinus rinse:  Ask your child's health care provider before performing a sinus rinse on your child.  Do not do a sinus rinse if you have had ear or nasal surgery, ear infection, or blocked ears.  How do I do a sinus rinse?  Wash your hands.  Disinfect your device according to the directions provided and then dry it.  Use the solution that comes with your device or one that is sold separately in stores. Follow the mixing directions on the package.  Fill your device with the amount of saline solution as directed by the device instructions.  Stand over a sink and tilt your head sideways over the  sink.  Place the spout of the device in your upper nostril (the one closer to the ceiling).  Gently pour or squeeze the saline solution into the nasal cavity. The liquid should drain to the lower nostril if you are not overly congested.  Gently blow your nose. Blowing too hard may cause ear pain.  Repeat in the other nostril.  Clean and rinse your device with clean water and then air-dry it. Are there risks of a sinus rinse? Sinus rinse is generally very safe and effective. However, there are a few risks, which include:  A burning sensation in the sinuses. This may happen if you do not make the saline solution as directed. Make sure to follow all directions when making the saline solution.  Infection from contaminated water. This is rare, but possible.  Nasal irritation.  This information is not intended to replace advice given to you by your health care provider. Make sure you discuss any questions you have with your health care provider. Document Released: 09/18/2013 Document Revised: 01/19/2016 Document Reviewed: 07/09/2013 Elsevier Interactive Patient Education  2017 [employer]. Otitis Media, Adult Otitis media occurs when there is inflammation and fluid in the middle ear. Your middle ear is a part of the ear that contains bones  for hearing as well as air that helps send sounds to your brain. What are the causes? This condition is caused by a blockage in the eustachian tube. This tube drains fluid from the ear to the back of the nose (nasopharynx). A blockage in this tube can be caused by an object or by swelling (edema) in the tube. Problems that can cause a blockage include:  A cold or other upper respiratory infection.  Allergies.  An irritant, such as tobacco smoke.  Enlarged adenoids. The adenoids are areas of soft tissue located high in the back of the throat, behind the nose and the roof of the mouth.  A mass in the nasopharynx.  Damage to the ear caused by  pressure changes (barotrauma).  What are the signs or symptoms? Symptoms of this condition include:  Ear pain.  A fever.  Decreased hearing.  A headache.  Tiredness (lethargy).  Fluid leaking from the ear.  Ringing in the ear.  How is this diagnosed? This condition is diagnosed with a physical exam. During the exam your health care provider will use an instrument called an otoscope to look into your ear and check for redness, swelling, and fluid. He or she will also ask about your symptoms. Your health care provider may also order tests, such as:  A test to check the movement of the eardrum (pneumatic otoscopy). This test is done by squeezing a small amount of air into the ear.  A test that changes air pressure in the middle ear to check how well the eardrum moves and whether the eustachian tube is working (tympanogram).  How is this treated? This condition usually goes away on its own within 3-5 days. But if the condition is caused by a bacteria infection and does not go away own its own, or keeps coming back, your health care provider may:  Prescribe antibiotic medicines to treat the infection.  Prescribe or recommend medicines to control pain.  Follow these instructions at home:  Take over-the-counter and prescription medicines only as told by your health care provider.  If you were prescribed an antibiotic medicine, take it as told by your health care provider. Do not stop taking the antibiotic even if you start to feel better.  Keep all follow-up visits as told by your health care provider. This is important. Contact a health care provider if:  You have bleeding from your nose.  There is a lump on your neck.  You are not getting better in 5 days.  You feel worse instead of better. Get help right away if:  You have severe pain that is not controlled with medicine.  You have swelling, redness, or pain around your ear.  You have stiffness in your neck.  A  part of your face is paralyzed.  The bone behind your ear (mastoid) is tender when you touch it.  You develop a severe headache. Summary  Otitis media is redness, soreness, and swelling of the middle ear.  This condition usually goes away on its own within 3-5 days.  If the problem does not go away in 3-5 days, your health care provider may prescribe or recommend medicines to treat your symptoms.  If you were prescribed an antibiotic medicine, take it as told by your health care provider. This information is not intended to replace advice given to you by your health care provider. Make sure you discuss any questions you have with your health care provider. Document Released: 11/27/2003 Document Revised: 02/12/2016 Document  Reviewed: 02/12/2016 Elsevier Interactive Patient Education  [employer].

## 2017-03-28 ENCOUNTER — Ambulatory Visit: Payer: Self-pay | Admitting: *Deleted

## 2017-03-28 VITALS — BP 137/91 | HR 94 | Temp 98.7°F

## 2017-03-28 DIAGNOSIS — J0120 Acute ethmoidal sinusitis, unspecified: Secondary | ICD-10-CM

## 2017-03-28 DIAGNOSIS — B373 Candidiasis of vulva and vagina: Secondary | ICD-10-CM

## 2017-03-28 DIAGNOSIS — B3731 Acute candidiasis of vulva and vagina: Secondary | ICD-10-CM

## 2017-03-28 DIAGNOSIS — K219 Gastro-esophageal reflux disease without esophagitis: Secondary | ICD-10-CM

## 2017-03-28 MED ORDER — FLUCONAZOLE 150 MG PO TABS: 150.0000 mg | ORAL_TABLET | Freq: Once | ORAL | 0 refills | Status: AC

## 2017-03-28 NOTE — Progress Notes (Signed)
Pt c/o frontal sinus pain and sinus HA, post nasal drip, sore throat and burning in throat x3 days. Believes sore throat is related to PND. Treated earlier this month for sinusitis with Augmentin, Flonase and saline nasal spray.   Given phenylephrine from clinic stock for recurrent sinus pain and PND. Still using Flonase at home. Using Mucinex DM 12 hour, however having very little if any congestion, and only upon waking. Suggested she could try just the phenylephrine during the day and the mucinex at night before bed. Appt with NP Thursday if sx not improving since already previously treated for same.   Endorses burning in throat is located primarily at mid central chest. Improves temporarily with drinking cold liquids. Occurring intermittently over past few days. Unsure if it worsens with food. Has had mild reflux in past. Reports it feels similar to this just much more severe.   Suggested pt start with chewable antacids (Tums) for today and next few days for quick relief and try Omeprazole otc for reflux. Plans to f/u with RN on Thursday, though pt informed omeprazole may need longer than that for full effect, up to 2 weeks.   Pt also reports vaginal yeast infection sx of burning, irritation, itching, discharge x1 week 2/2 recent abx use. Sts she ate yogurt daily during treatment, but sx started towards end of course. Requesting Diflucan Rx. Pharmacy of choice confirmed.

## 2017-03-30 ENCOUNTER — Ambulatory Visit: Payer: Self-pay | Admitting: Registered Nurse

## 2017-03-30 ENCOUNTER — Encounter: Payer: Self-pay | Admitting: Registered Nurse

## 2017-03-30 VITALS — BP 123/89 | HR 87 | Temp 98.2°F

## 2017-03-30 DIAGNOSIS — J029 Acute pharyngitis, unspecified: Secondary | ICD-10-CM

## 2017-03-30 DIAGNOSIS — B3731 Acute candidiasis of vulva and vagina: Secondary | ICD-10-CM

## 2017-03-30 DIAGNOSIS — B373 Candidiasis of vulva and vagina: Secondary | ICD-10-CM

## 2017-03-30 DIAGNOSIS — J310 Chronic rhinitis: Secondary | ICD-10-CM

## 2017-03-30 DIAGNOSIS — R12 Heartburn: Secondary | ICD-10-CM

## 2017-03-30 MED ORDER — PREDNISONE 10 MG PO TABS: 20.0000 mg | ORAL_TABLET | Freq: Every day | ORAL | 0 refills | Status: AC

## 2017-03-30 MED ORDER — FEXOFENADINE HCL 180 MG PO TABS: 180.0000 mg | ORAL_TABLET | Freq: Every day | ORAL | Status: DC

## 2017-03-30 NOTE — Progress Notes (Signed)
Subjective:    Patient ID: Sydney Spencer, female    DOB: Jun 26, 1975, 42 y.o.   MRN: [id number]  41y/o single caucasian female established RN Rolly Salter saw pt on 1/22 for chest pressure, throat burning. Recommended tums and starting Omeprazole otc. Pt reports chest pressure had worsened to the point if felt like it was going straight through to her back, but about 3 hours ago, pressure started releasing and burning in throat resolved. Wants to make sure nothing else r/t her throat could be going on.  Took diflucan last night for vaginal discharge and continuing to eat [place] daily.  Using phenylephrine OTC, nasal saline, flonase still for post nasal drip and having a little bit of dizzyness with position changes.  Ears are popping now.      Review of Systems  Constitutional: Negative for activity change, appetite change, chills, diaphoresis, fatigue, fever and unexpected weight change.  HENT: Positive for congestion, ear pain, postnasal drip and sore throat. Negative for dental problem, drooling, ear discharge, facial swelling, hearing loss, mouth sores, nosebleeds, rhinorrhea, sinus pressure, sinus pain, sneezing, tinnitus, trouble swallowing and voice change.   Eyes: Negative for photophobia, pain, discharge, redness, itching and visual disturbance.  Respiratory: Negative for cough, choking, chest tightness, shortness of breath, wheezing and stridor.   Cardiovascular: Negative for chest pain, palpitations and leg swelling.  Gastrointestinal: Negative for abdominal distention, abdominal pain, blood in stool, constipation, diarrhea, nausea and vomiting.  Endocrine: Negative for cold intolerance and heat intolerance.  Genitourinary: Negative for difficulty urinating, dysuria and hematuria.  Musculoskeletal: Negative for arthralgias, back pain, gait problem, joint swelling, myalgias, neck pain and neck stiffness.  Skin: Negative for color change, pallor, rash and wound.  Allergic/Immunologic:  Positive for environmental allergies. Negative for food allergies.  Neurological: Positive for headaches. Negative for dizziness, tremors, seizures, syncope, facial asymmetry, speech difficulty, weakness, light-headedness and numbness.  Hematological: Negative for adenopathy. Does not bruise/bleed easily.  Psychiatric/Behavioral: Negative for agitation, behavioral problems, confusion and sleep disturbance.       Objective:   Physical Exam  Constitutional: She is oriented to person, place, and time. She appears well-developed and well-nourished. She is active and cooperative.  Non-toxic appearance. She does not have a sickly appearance. She appears ill. No distress.  HENT:  Head: Normocephalic and atraumatic.  Right Ear: Hearing, external ear and ear canal normal. A middle ear effusion is present.  Left Ear: Hearing, external ear and ear canal normal. A middle ear effusion is present.  Nose: Mucosal edema and rhinorrhea present. No nose lacerations, sinus tenderness, nasal deformity, septal deviation or nasal septal hematoma. No epistaxis.  No foreign bodies. Right sinus exhibits maxillary sinus tenderness and frontal sinus tenderness. Left sinus exhibits no maxillary sinus tenderness and no frontal sinus tenderness.  Mouth/Throat: Uvula is midline and mucous membranes are normal. Mucous membranes are not pale, not dry and not cyanotic. She does not have dentures. No oral lesions. No trismus in the jaw. Normal dentition. No dental abscesses, uvula swelling, lacerations or dental caries. Posterior oropharyngeal edema and posterior oropharyngeal erythema present. No oropharyngeal exudate or tonsillar abscesses.  2 vesicles noted posterior oropharnx left and cobblestoning posterior pharynx; macular erythema oropharynx; bilateral allergic shiners; bilateral TMS air fluid level clear right TM slight erythema 2 oclock; scarring left 4-7 oclock  Eyes: Conjunctivae, EOM and lids are normal. Pupils are equal,  round, and reactive to light. Right eye exhibits no chemosis, no discharge, no exudate and no hordeolum. No foreign body present in  the right eye. Left eye exhibits no chemosis, no discharge, no exudate and no hordeolum. No foreign body present in the left eye. Right conjunctiva is not injected. Right conjunctiva has no hemorrhage. Left conjunctiva is not injected. Left conjunctiva has no hemorrhage. No scleral icterus. Right eye exhibits normal extraocular motion and no nystagmus. Left eye exhibits normal extraocular motion and no nystagmus. Right pupil is round and reactive. Left pupil is round and reactive. Pupils are equal.  Neck: Trachea normal and normal range of motion. Neck supple. No tracheal tenderness, no spinous process tenderness and no muscular tenderness present. No neck rigidity. No tracheal deviation, no edema, no erythema and normal range of motion present. No thyroid mass and no thyromegaly present.  Cardiovascular: Normal rate, regular rhythm, S1 normal, S2 normal, normal heart sounds and intact distal pulses. PMI is not displaced. Exam reveals no gallop and no friction rub.  No murmur heard. Pulmonary/Chest: Effort normal and breath sounds normal. No accessory muscle usage or stridor. No respiratory distress. She has no decreased breath sounds. She has no wheezes. She has no rhonchi. She has no rales. She exhibits no tenderness.  Abdominal: Soft. Normal appearance and bowel sounds are normal. She exhibits no shifting dullness, no distension, no pulsatile liver, no fluid wave, no abdominal bruit, no ascites, no pulsatile midline mass and no mass. There is no hepatosplenomegaly. There is no tenderness. There is no rigidity, no rebound, no guarding, no CVA tenderness, no tenderness at McBurney's point and negative Murphy's sign. Hernia confirmed negative in the ventral area.  Dull to percussion x 4 quads; normoactive bowel sounds x 4 quads  Musculoskeletal: Normal range of motion. She  exhibits no edema or tenderness.       Right shoulder: Normal.       Left shoulder: Normal.       Right hip: Normal.       Left hip: Normal.       Right knee: Normal.       Left knee: Normal.       Cervical back: Normal.       Right hand: Normal.       Left hand: Normal.  Lymphadenopathy:       Head (right side): No submental, no submandibular, no tonsillar, no preauricular, no posterior auricular and no occipital adenopathy present.       Head (left side): No submental, no submandibular, no tonsillar, no preauricular, no posterior auricular and no occipital adenopathy present.    She has no cervical adenopathy.       Right cervical: No superficial cervical, no deep cervical and no posterior cervical adenopathy present.      Left cervical: No superficial cervical, no deep cervical and no posterior cervical adenopathy present.  Neurological: She is alert and oriented to person, place, and time. She has normal strength. She is not disoriented. She displays no atrophy and no tremor. No cranial nerve deficit or sensory deficit. She exhibits normal muscle tone. She displays no seizure activity. Coordination and gait normal. GCS eye subscore is 4. GCS verbal subscore is 5. GCS motor subscore is 6.  Skin: Skin is warm, dry and intact. No abrasion, no bruising, no burn, no ecchymosis, no laceration, no lesion, no petechiae and no rash noted. She is not diaphoretic. No cyanosis or erythema. No pallor. Nails show no clubbing.  Psychiatric: She has a normal mood and affect. Her speech is normal and behavior is normal. Judgment and thought content normal. Cognition and memory  are normal.  Nursing note and vitals reviewed.         Assessment & Plan:  A-acute pharyngitis, heartburn, nonallergic rhinitis, vaginal candidiasis  P-Restart allegra 180mg  po daily, prednisone 20mg  po daily with breakfast x 4 days #21 RF0 dispensed from [facility] to patient and continue normal saline nasal spray 2 sprays each  nostril q2h wa as needed. flonase 50mcg 1 spray each nostril BID.  Discussed dizzyness can be from fluid in ears and/or phenylephrine use or both.  Hydrate to keep urine pale clear yellow.  Patient denied personal or family history of ENT cancer. Avoid triggers if possible.  Shower prior to bedtime if exposed to triggers. Call or return to clinic as needed if these symptoms worsen or fail to improve as anticipated.   Exitcare handout on nonallergic rhinitis and sinus rinse given to patient.  Patient verbalized understanding of instructions, agreed with plan of care and had no further questions at this time.  P2:  Avoidance and hand washing.   Motrin 800mg  po TID prn pain take with food at home or  tylenol 1000mg  po QID prn pain/fever. Prednisone 20mg  po daily x 4 days #21 RF0 dispensed from [facility] to patient. Usually no specific medical treatment is needed if a virus is causing the sore throat.  The throat most often gets better on its own within 5 to 7 days.  Antibiotic medicine does not cure viral pharyngitis.  Post nasal drip can cause sore throat. For acute pharyngitis caused by bacteria, your healthcare provider will prescribe an antibiotic.  [place] Kitchen. Do not smoke.  [place] Kitchen. Avoid secondhand smoke and other air pollutants.  . Use a cool mist humidifier to add moisture to the air.  . Get plenty of rest.  . You may want to rest your throat by talking less and eating a diet that is mostly liquid or soft for a day or two.  Hydrate to keep urine clear pale yellow . Nonprescription throat lozenges and mouthwashes should help relieve the soreness.   . Gargling with warm saltwater and drinking warm liquids may help.  (You can make a saltwater solution by adding 1/4 teaspoon of salt to 8 ounces, or 240 mL, of warm water.)  . A nonprescription pain reliever such as tylenol/acetaminophen may ease general aches and pains.   FOLLOW UP with clinic provider if no improvements in the next 7-10 days.  Exitcare handout on  pharyngitis given to patient. Patient verbalized understanding of instructions and agreed with plan of care.  Patient had no further questions at this time. P2:  Hand washing and diet.  Continue prilosec 20mg  po daily OTC x 2 weeks and tums po q2-3h prn breakthrough heartburn  Avoid trigger foods.  Smoking and caffeine and peppermints may worsen symptoms also.  Could be related to viral illness and/or treatment with antibiotics use of OTC NSAIDs taking on empty stomach that gave patient gastritis.  Patient given exitcare handout on gastritis.  Follow up re-evaluation vomiting, hematemesis/hematochezia, worsening pain.  Patient verbalized understanding information/instructions, agreed with plan of care and had no further questions at this time.  Follow up re-evaluation if worsening vaginal discharge/pain.  Treated with diflucan 150mg  po x 1 last night.  Continue activia yogurt daily.  Patient verbalized understanding information/instructions, agreed with plan of care and had no further questions at this time.

## 2017-03-30 NOTE — Patient Instructions (Addendum)
Vaginal Yeast infection, Adult Vaginal yeast infection is a condition that causes soreness, swelling, and redness (inflammation) of the vagina. It also causes vaginal discharge. This is a common condition. Some women get this infection frequently. What are the causes? This condition is caused by a change in the normal balance of the yeast (candida) and bacteria that live in the vagina. This change causes an overgrowth of yeast, which causes the inflammation. What increases the risk? This condition is more likely to develop in:  Women who take antibiotic medicines.  Women who have diabetes.  Women who take birth control pills.  Women who are pregnant.  Women who douche often.  Women who have a weak defense (immune) system.  Women who have been taking steroid medicines for a long time.  Women who frequently wear tight clothing.  What are the signs or symptoms? Symptoms of this condition include:  White, thick vaginal discharge.  Swelling, itching, redness, and irritation of the vagina. The lips of the vagina (vulva) may be affected as well.  Pain or a burning feeling while urinating.  Pain during sex.  How is this diagnosed? This condition is diagnosed with a medical history and physical exam. This will include a pelvic exam. Your health care provider will examine a sample of your vaginal discharge under a microscope. Your health care provider may send this sample for testing to confirm the diagnosis. How is this treated? This condition is treated with medicine. Medicines may be over-the-counter or prescription. You may be told to use one or more of the following:  Medicine that is taken orally.  Medicine that is applied as a cream.  Medicine that is inserted directly into the vagina (suppository).  Follow these instructions at home:  Take or apply over-the-counter and prescription medicines only as told by your health care provider.  Do not have sex until your health  care provider has approved. Tell your sex partner that you have a yeast infection. That person should go to his or her health care provider if he or she develops symptoms.  Do not wear tight clothes, such as pantyhose or tight pants.  Avoid using tampons until your health care provider approves.  Eat more yogurt. This may help to keep your yeast infection from returning.  Try taking a sitz bath to help with discomfort. This is a warm water bath that is taken while you are sitting down. The water should only come up to your hips and should cover your buttocks. Do this 3-4 times per day or as told by your health care provider.  Do not douche.  Wear breathable, cotton underwear.  If you have diabetes, keep your blood sugar levels under control. Contact a health care provider if:  You have a fever.  Your symptoms go away and then return.  Your symptoms do not get better with treatment.  Your symptoms get worse.  You have new symptoms.  You develop blisters in or around your vagina.  You have blood coming from your vagina and it is not your menstrual period.  You develop pain in your abdomen. This information is not intended to replace advice given to you by your health care provider. Make sure you discuss any questions you have with your health care provider. Document Released: 12/01/2004 Document Revised: 08/05/2015 Document Reviewed: 08/25/2014 Elsevier Interactive Patient Education  2018 [employer] Otitis Media With Effusion, Pediatric Otitis media with effusion (OME) occurs when there is inflammation of the middle ear and fluid  in the middle ear space. There are no signs and symptoms of infection. The middle ear space contains air and the bones for hearing. Air in the middle ear space helps to transmit sound to the brain. OME is a common condition in children, and it often occurs after an ear infection. This condition may be present for several weeks or longer after an ear  infection. Most cases of this condition get better on their own. What are the causes? OME is caused by a blockage of the eustachian tube in one or both ears. These tubes drain fluid in the ears to the back of the nose (nasopharynx). If the tissue in the tube swells up (edema), the tube closes. This prevents fluid from draining. Blockage can be caused by:  Ear infections.  Colds and other upper respiratory infections.  Allergies.  Irritants, such as tobacco smoke.  Enlarged adenoids. The adenoids are areas of soft tissue located high in the back of the throat, behind the nose and the roof of the mouth. They are part of the body's natural defense (immune) system.  A mass in the nasopharynx.  Damage to the ear caused by pressure changes (barotrauma).  What increases the risk? Your child is more likely to develop this condition if:  He or she has repeated ear and sinus infections.  He or she has allergies.  He or she is exposed to tobacco smoke.  He or she attends daycare.  He or she is not breastfed.  What are the signs or symptoms? Symptoms of this condition may not be obvious. Sometimes this condition does not have any symptoms, or symptoms may overlap with those of a cold or upper respiratory tract illness. Symptoms of this condition include:  Temporary hearing loss.  A feeling of fullness in the ear without pain.  Irritability or agitation.  Balance (vestibular) problems.  As a result of hearing loss, your child may:  Listen to the TV at a loud volume.  Not respond to questions.  Ask "What?" often when spoken to.  Mistake or confuse one sound or word for another.  Perform poorly at school.  Have a poor attention span.  Become agitated or irritated easily.  How is this diagnosed? This condition is diagnosed with an ear exam. Your child's health care provider will look inside your child's ear with an instrument (otoscope) to check for redness, swelling, and  fluid. Other tests may be done, including:  A test to check the movement of the eardrum (pneumatic otoscopy). This is done by squeezing a small amount of air into the ear.  A test that changes air pressure in the middle ear to check how well the eardrum moves and to see if the eustachian tube is working (tympanogram).  Hearing test (audiogram). This test involves playing tones at different pitches to see if your child can hear each tone.  How is this treated? Treatment for this condition depends on the cause. In many cases, the fluid goes away on its own. In some cases, your child may need a procedure to create a hole in the eardrum to allow fluid to drain (myringotomy) and to insert small drainage tubes (tympanostomy tubes) into the eardrums. These tubes help to drain fluid and prevent infection. This procedure may be recommended if:  OME does not get better over several months.  Your child has many ear infections within several months.  Your child has noticeable hearing loss.  Your child has problems with speech and language  development.  Surgery may also be done to remove the adenoids (adenoidectomy). Follow these instructions at home:  Give over-the-counter and prescription medicines only as told by your child's health care provider.  Keep children away from any tobacco smoke.  Keep all follow-up visits as told by your child's health care provider. This is important. How is this prevented?  Keep your child's vaccinations up to date. Make sure your child gets all recommended vaccinations, including a pneumonia and flu vaccine.  Encourage hand washing. Your child should wash his or her hands often with soap and water. If there is no soap and water, he or she should use hand sanitizer.  Avoid exposing your child to tobacco smoke.  Breastfeed your baby, if possible. Babies who are breastfed as long as possible are less likely to develop this condition. Contact a health care  provider if:  Your child's hearing does not get better after 3 months.  Your child's hearing is worse.  Your child has ear pain.  Your child has a fever.  Your child has drainage from the ear.  Your child is dizzy.  Your child has a lump on his or her neck. Get help right away if:  Your child has bleeding from the nose.  Your child cannot move part of her or his face.  Your child has trouble breathing.  Your child cannot smell.  Your child develops severe congestion.  Your child develops weakness.  Your child who is younger than 3 months has a temperature of 100F (38C) or higher. Summary  Otitis media with effusion (OME) occurs when there is inflammation of the middle ear and fluid in the middle ear space.  This condition is caused by blockage of one or both eustachian tubes, which drain fluid in the ears to the back of the nose.  Symptoms of this condition can include temporary hearing loss, a feeling of fullness in the ear, irritability or agitation, and balance (vestibular) problems. Sometimes, there are no symptoms.  This condition is diagnosed with an ear exam and tests, such as pneumatic otoscopy, tympanogram, and audiogram.  Treatment for this condition depends on the cause. In many cases, the fluid goes away on its own. This information is not intended to replace advice given to you by your health care provider. Make sure you discuss any questions you have with your health care provider. Document Released: 05/14/2003 Document Revised: 01/14/2016 Document Reviewed: 01/14/2016 Elsevier Interactive Patient Education  2017 [employer] Pharyngitis Pharyngitis is redness, pain, and swelling (inflammation) of the throat (pharynx). It is a very common cause of sore throat. Pharyngitis can be caused by a bacteria, but it is usually caused by a virus. Most cases of pharyngitis get better on their own without treatment. What are the causes? This condition may be  caused by:  Infection by viruses (viral). Viral pharyngitis spreads from person to person (is contagious) through coughing, sneezing, and sharing of personal items or utensils such as cups, forks, spoons, and toothbrushes.  Infection by bacteria (bacterial). Bacterial pharyngitis may be spread by touching the nose or face after coming in contact with the bacteria, or through more intimate contact, such as kissing.  Allergies. Allergies can cause buildup of mucus in the throat (post-nasal drip), leading to inflammation and irritation. Allergies can also cause blocked nasal passages, forcing breathing through the mouth, which dries and irritates the throat.  What increases the risk? You are more likely to develop this condition if:  You are 5-24 years  old.  Bonita Quin are exposed to crowded environments such as daycare, school, or dormitory living.  You live in a cold climate.  You have a weakened disease-fighting (immune) system.  What are the signs or symptoms? Symptoms of this condition vary by the cause (viral, bacterial, or allergies) and can include:  Sore throat.  Fatigue.  Low-grade fever.  Headache.  Joint pain and muscle aches.  Skin rashes.  Swollen glands in the throat (lymph nodes).  Plaque-like film on the throat or tonsils. This is often a symptom of bacterial pharyngitis.  Vomiting.  Stuffy nose (nasal congestion).  Cough.  Red, itchy eyes (conjunctivitis).  Loss of appetite.  How is this diagnosed? This condition is often diagnosed based on your medical history and a physical exam. Your health care provider will ask you questions about your illness and your symptoms. A swab of your throat may be done to check for bacteria (rapid strep test). Other lab tests may also be done, depending on the suspected cause, but these are rare. How is this treated? This condition usually gets better in 3-4 days without medicine. Bacterial pharyngitis may be treated with  antibiotic medicines. Follow these instructions at home:  Take over-the-counter and prescription medicines only as told by your health care provider. ? If you were prescribed an antibiotic medicine, take it as told by your health care provider. Do not stop taking the antibiotic even if you start to feel better. ? Do not give children aspirin because of the association with Reye syndrome.  Drink enough water and fluids to keep your urine clear or pale yellow.  Get a lot of rest.  Gargle with a salt-water mixture 3-4 times a day or as needed. To make a salt-water mixture, completely dissolve -1 tsp of salt in 1 cup of warm water.  If your health care provider approves, you may use throat lozenges or sprays to soothe your throat. Contact a health care provider if:  You have large, tender lumps in your neck.  You have a rash.  You cough up green, yellow-brown, or bloody spit. Get help right away if:  Your neck becomes stiff.  You drool or are unable to swallow liquids.  You cannot drink or take medicines without vomiting.  You have severe pain that does not go away, even after you take medicine.  You have trouble breathing, and it is not caused by a stuffy nose.  You have new pain and swelling in your joints such as the knees, ankles, wrists, or elbows. Summary  Pharyngitis is redness, pain, and swelling (inflammation) of the throat (pharynx).  While pharyngitis can be caused by a bacteria, the most common causes are viral.  Most cases of pharyngitis get better on their own without treatment.  Bacterial pharyngitis is treated with antibiotic medicines. This information is not intended to replace advice given to you by your health care provider. Make sure you discuss any questions you have with your health care provider. Document Released: 02/21/2005 Document Revised: 03/29/2016 Document Reviewed: 03/29/2016 Elsevier Interactive Patient Education  2018 [employer].  Heartburn Heartburn is a type of pain or discomfort that can happen in the throat or chest. It is often described as a burning pain. It may also cause a bad taste in the mouth. Heartburn may feel worse when you lie down or bend over, and it is often worse at night. Heartburn may be caused by stomach contents that move back up into the esophagus (reflux). Follow these  instructions at home: Take these actions to decrease your discomfort and to help avoid complications. Diet  Follow a diet as recommended by your health care provider. This may involve avoiding foods and drinks such as: ? Coffee and tea (with or without caffeine). ? Drinks that contain alcohol. ? Energy drinks and sports drinks. ? Carbonated drinks or sodas. ? Chocolate and cocoa. ? Peppermint and mint flavorings. ? Garlic and onions. ? Horseradish. ? Spicy and acidic foods, including peppers, chili powder, curry powder, vinegar, hot sauces, and barbecue sauce. ? Citrus fruit juices and citrus fruits, such as oranges, lemons, and limes. ? Tomato-based foods, such as red sauce, chili, salsa, and pizza with red sauce. ? Fried and fatty foods, such as donuts, french fries, potato chips, and high-fat dressings. ? High-fat meats, such as hot dogs and fatty cuts of red and white meats, such as rib eye steak, sausage, ham, and bacon. ? High-fat dairy items, such as whole milk, butter, and cream cheese.  Eat small, frequent meals instead of large meals.  Avoid drinking large amounts of liquid with your meals.  Avoid eating meals during the 2-3 hours before bedtime.  Avoid lying down right after you eat.  Do not exercise right after you eat. General instructions  Pay attention to any changes in your symptoms.  Take over-the-counter and prescription medicines only as told by your health care provider. Do not take aspirin, ibuprofen, or other NSAIDs unless your health care provider told you to do so.  Do not use any  tobacco products, including cigarettes, chewing tobacco, and e-cigarettes. If you need help quitting, ask your health care provider.  Wear loose-fitting clothing. Do not wear anything tight around your waist that causes pressure on your abdomen.  Raise (elevate) the head of your bed about 6 inches (15 cm).  Try to reduce your stress, such as with yoga or meditation. If you need help reducing stress, ask your health care provider.  If you are overweight, reduce your weight to an amount that is healthy for you. Ask your health care provider for guidance about a safe weight loss goal.  Keep all follow-up visits as told by your health care provider. This is important. Contact a health care provider if:  You have new symptoms.  You have unexplained weight loss.  You have difficulty swallowing, or it hurts to swallow.  You have wheezing or a persistent cough.  Your symptoms do not improve with treatment.  You have frequent heartburn for more than two weeks. Get help right away if:  You have pain in your arms, neck, jaw, teeth, or back.  You feel sweaty, dizzy, or light-headed.  You have chest pain or shortness of breath.  You vomit and your vomit looks like blood or coffee grounds.  Your stool is bloody or black. This information is not intended to replace advice given to you by your health care provider. Make sure you discuss any questions you have with your health care provider. Document Released: 07/10/2008 Document Revised: 07/30/2015 Document Reviewed: 06/18/2014 Elsevier Interactive Patient Education  2018 [employer].  Gastritis, Adult Gastritis is inflammation of the stomach. There are two kinds of gastritis:  Acute gastritis. This kind develops suddenly.  Chronic gastritis. This kind lasts for a long time.  Gastritis happens when the lining of the stomach becomes weak or gets damaged. Without treatment, gastritis can lead to stomach bleeding and ulcers. What are  the causes? This condition may be caused by:  An  infection.  Drinking too much alcohol.  Certain medicines.  Having too much acid in the stomach.  A disease of the intestines or stomach.  Stress.  What are the signs or symptoms? Symptoms of this condition include:  Pain or a burning in the upper abdomen.  Nausea.  Vomiting.  An uncomfortable feeling of fullness after eating.  In some cases, there are no symptoms. How is this diagnosed? This condition may be diagnosed with:  A description of your symptoms.  A physical exam.  Tests. These can include: ? Blood tests. ? Stool tests. ? A test in which a thin, flexible instrument with a light and camera on the end is passed down the esophagus and into the stomach (upper endoscopy). ? A test in which a sample of tissue is taken for testing (biopsy).  How is this treated? This condition may be treated with medicines. If the condition is caused by a bacterial infection, you may be given antibiotic medicines. If it is caused by too much acid in the stomach, you may get medicines called H2 blockers, proton pump inhibitors, or antacids. Treatment may also involve stopping the use of certain medicines, such as aspirin, ibuprofen, or other nonsteroidal anti-inflammatory drugs (NSAIDs). Follow these instructions at home:  Take over-the-counter and prescription medicines only as told by your health care provider.  If you were prescribed an antibiotic, take it as told by your health care provider. Do not stop taking the antibiotic even if you start to feel better.  Drink enough fluid to keep your urine clear or pale yellow.  Eat small, frequent meals instead of large meals. Contact a health care provider if:  Your symptoms get worse.  Your symptoms return after treatment. Get help right away if:  You vomit blood or material that looks like coffee grounds.  You have black or dark red stools.  You are unable to keep fluids  down.  Your abdominal pain gets worse.  You have a fever.  You do not feel better after 1 week. This information is not intended to replace advice given to you by your health care provider. Make sure you discuss any questions you have with your health care provider. Document Released: 02/15/2001 Document Revised: 10/21/2015 Document Reviewed: 11/15/2014 Elsevier Interactive Patient Education  [employer].

## 2017-04-07 ENCOUNTER — Telehealth: Payer: Self-pay | Admitting: *Deleted

## 2017-04-07 MED ORDER — CLOTRIMAZOLE 10 MG MT TROC: 10.0000 mg | Freq: Every day | OROMUCOSAL | 1 refills | Status: AC

## 2017-04-07 NOTE — Telephone Encounter (Signed)
Pt to clinic c/o raw burning sensation to throat since x1 day. Small, white lesions noted to roof of mouth, peritonsillar area, back of tongue. She reports she noticed these yesterday as well. No difficulty swallowing. Recent use of Augmentin and Prednisone. Also recently treated for vaginal candidiasis. Verbal order received from Albina Billetina Betancourt, NP for antifungal lozenges. Pt made aware of e-prescribed Rx.

## 2017-05-18 ENCOUNTER — Ambulatory Visit: Payer: Self-pay | Admitting: Registered Nurse

## 2017-05-18 VITALS — BP 121/83 | HR 78 | Temp 97.8°F

## 2017-05-18 DIAGNOSIS — S66211A Strain of extensor muscle, fascia and tendon of right thumb at wrist and hand level, initial encounter: Secondary | ICD-10-CM

## 2017-05-18 NOTE — Patient Instructions (Signed)
Thumb Extension: Isometric    Keeping left hand closed with thumb in rest position, apply gentle resistance with index and middle fingers. Do not allow thumb to move. Hold ____ seconds. Relax. Repeat ____ times per set. Do ____ sets per session. Do ____ sessions per day.  Copyright  VHI. All rights reserved.  Finger / Thumb Extensors    Place right fingers and thumb in center of putty donut, stretch out. Repeat ____ times. Do ____ sessions per day.  Copyright  VHI. All rights reserved.  Work Injury Prevention (Supination)    Avoid repetitive use of one hand which involves turning palm upward. Solution: Position palm inward, or use both hands to pick up. Take frequent breaks during the day.  Copyright  VHI. All rights reserved.  Splint Instructions (Immobilization)  Splint is used to support injured structures and to prevent movement so healing can take place. Do not remove until instructed to do so. If you note pressure areas or increased swelling, notify your therapist at: Tel. No. : ________________.   Copyright  VHI. All rights reserved.  Thumb Sprain A thumb sprain is an injury to one of the strong bands of tissue (ligaments) that connect the bones in your thumb. The ligament can be stretched too much or it can tear. A tear can be either partial or complete. The severity of the sprain depends on how much of the ligament was damaged or torn. What are the causes? A thumb sprain is often caused by a fall or an accident. If you extend your hands to catch an object or to protect yourself, the force of the impact can cause your ligament to stretch too much. This excess tension can also cause your ligament to tear. What increases the risk? This injury is more likely to occur in people who play:  Sports that involve a greater risk of falling, such as skiing.  Sports that involve catching an object, such as basketball.  What are the signs or symptoms? Symptoms of this  condition include:  Loss of motion in your thumb.  Bruising.  Tenderness.  Swelling.  How is this diagnosed? This condition is diagnosed with a medical history and physical exam. You may also have an X-ray of your thumb. How is this treated? Treatment varies depending on the severity of your sprain. If your ligament is overstretched or partially torn, treatment usually involves keeping your thumb in a fixed position (immobilization) for a period of time. To help you do this, your health care provider will apply a bandage, cast, or splint to keep your thumb from moving until it heals. If your ligament is fully torn, you may need surgery to reconnect the ligament to the bone. After surgery, a cast or splint will be applied and will need to stay on your thumb while it heals. Your health care provider may also suggest exercises or physical therapy to strengthen your thumb. Follow these instructions at home: If you have a cast:  Do not stick anything inside the cast to scratch your skin. Doing that increases your risk of infection.  Check the skin around the cast every day. Report any concerns to your health care provider. You may put lotion on dry skin around the edges of the cast. Do not apply lotion to the skin underneath the cast.  Keep the cast clean and dry. If you have a splint:  Wear it as directed by your health care provider. Remove it only as directed by your health care  provider.  Loosen the splint if your fingers become numb and tingle, or if they turn cold and blue.  Keep the splint clean and dry. Bathing  Cover the bandage, cast, or splint with a watertight plastic bag to protect it from water while you take a bath or a shower. Do not let the bandage, cast, or splint get wet. Managing pain, stiffness, and swelling  If directed, apply ice to the injured area (unless you have a cast): ? Put ice in a plastic bag. ? Place a towel between your skin and the bag. ? Leave the  ice on for 20 minutes, 2-3 times per day.  Move your fingers often to avoid stiffness and to lessen swelling.  Raise (elevate) the injured area above the level of your heart while you are sitting or lying down. Driving  Do not drive or operate heavy machinery while taking pain medicine.  Do not drive while wearing a cast or splint on a hand that you use for driving. General instructions  Do not put pressure on any part of your cast or splint until it is fully hardened. This may take several hours.  Take medicines only as directed by your health care provider. These include over-the-counter medicines and prescription medicines.  Keep all follow-up visits as directed by your health care provider. This is important.  Do any exercise or physical therapy as directed by your health care provider.  Do not wear rings on your injured thumb. Contact a health care provider if:  Your pain is not controlled with medicine.  Your bruising or swelling gets worse.  Your cast or splint is damaged. Get help right away if:  Your thumb is numb or blue.  Your thumb feels colder than normal. This information is not intended to replace advice given to you by your health care provider. Make sure you discuss any questions you have with your health care provider. Document Released: 03/31/2004 Document Revised: 10/25/2015 Document Reviewed: 12/03/2013 Elsevier Interactive Patient Education  [employer].

## 2017-05-18 NOTE — Progress Notes (Signed)
Subjective:    Patient ID: Sydney Spencer, female    DOB: Aug 07, 1975, 42 y.o.   MRN: [id number]  42y/o single caucasian female established Pt reports R thumb pain at proximal joint x3 months. Believes it is work related. Recalls a specific injury that she was bent down and hand was turned inward and she gripped a round knob very tightly and turned it clockwise. At that same time she felt an excruciating pain R CMC joint. Did not radiate. She thought it would improve but hasn't.  Worsened last week trying to remove hot tub filter (twisting motion).  Pain is constant but worse with gripping, lifting, or fine motor skills such as writing for more than a few minutes. Has tried interventions of anti-inflammatories such as ibuprofen intermittently and intermittently an OTC thumb brace to immobolize CMC joint that extends across mid dorsum of hand.  Tries not to take motrin/aleve as it masks her pain and she does not want to overdue lifting/gripping and worsen pain/injury.  Has not been icing.  Right hand dominant  Denied bruising or rash.      Review of Systems  Constitutional: Negative for chills and fever.  HENT: Negative for trouble swallowing and voice change.   Eyes: Negative for photophobia and visual disturbance.  Respiratory: Negative for shortness of breath, wheezing and stridor.   Cardiovascular: Negative for chest pain.  Gastrointestinal: Negative for nausea and vomiting.  Endocrine: Negative for cold intolerance and heat intolerance.  Musculoskeletal: Positive for myalgias. Negative for gait problem, joint swelling, neck pain and neck stiffness.  Skin: Negative for color change, pallor, rash and wound.  Neurological: Negative for tremors, weakness and numbness.  Psychiatric/Behavioral: Negative for agitation, confusion and sleep disturbance.       Objective:   Physical Exam  Constitutional: She is oriented to person, place, and time. Vital signs are normal. She appears  well-developed and well-nourished. She is active and cooperative.  Non-toxic appearance. She does not have a sickly appearance. She does not appear ill. No distress.  HENT:  Head: Normocephalic and atraumatic.  Right Ear: External ear normal.  Left Ear: External ear normal.  Nose: Nose normal.  Mouth/Throat: Oropharynx is clear and moist. No oropharyngeal exudate.  Eyes: Conjunctivae, EOM and lids are normal. Pupils are equal, round, and reactive to light. Right eye exhibits no discharge. Left eye exhibits no discharge. No scleral icterus.  Neck: Trachea normal, normal range of motion and phonation normal. Neck supple. No muscular tenderness present. No neck rigidity. No tracheal deviation, no edema, no erythema and normal range of motion present.  Cardiovascular: Normal rate, regular rhythm, normal heart sounds and intact distal pulses.  Pulses:      Radial pulses are 2+ on the right side, and 2+ on the left side.  Pulmonary/Chest: Effort normal and breath sounds normal. No accessory muscle usage or stridor. No respiratory distress. She has no decreased breath sounds. She has no wheezes. She has no rhonchi. She has no rales.  Abdominal: Soft. Normal appearance. She exhibits no distension, no fluid wave and no ascites. There is no rigidity and no guarding.  Musculoskeletal: Normal range of motion. She exhibits tenderness. She exhibits no edema or deformity.       Right shoulder: Normal.       Left shoulder: Normal.       Right elbow: Normal.      Left elbow: Normal.       Right wrist: She exhibits tenderness. She exhibits normal range of  motion, no bony tenderness, no swelling, no effusion, no crepitus, no deformity and no laceration.       Left wrist: Normal.       Right hip: Normal.       Left hip: Normal.       Right knee: Normal.       Left knee: Normal.       Cervical back: Normal.       Thoracic back: Normal.       Lumbar back: Normal.       Right forearm: Normal.       Left  forearm: Normal.       Right hand: She exhibits tenderness. She exhibits normal range of motion, no bony tenderness, normal two-point discrimination, normal capillary refill, no deformity, no laceration and no swelling. Normal sensation noted. Normal strength noted. She exhibits no finger abduction, no thumb/finger opposition and no wrist extension trouble.       Left hand: Normal.       Hands: Pain right wrist right ulnar deviation + finkelstein test right; and pain abduction/opposition thumb to 5th finger right pain radiates into wrist; palmar thenar superficial soft tissue not TTP, webspace/snuffbox not TTP; no crepitus; no trigger finger; able to perform thumb abduction/adduction/extension/flexion, opposition and reposition without limited range of motion but does have pain with abduction/flexion/opposition especially with resistence  Lymphadenopathy:    She has no cervical adenopathy.  Neurological: She is alert and oriented to person, place, and time. No cranial nerve deficit. She exhibits normal muscle tone. Coordination normal.  Skin: Skin is warm, dry and intact. No rash noted. She is not diaphoretic. No erythema. No pallor.  Psychiatric: She has a normal mood and affect. Her speech is normal and behavior is normal. Judgment and thought content normal. Cognition and memory are normal.  Nursing note and vitals reviewed.       Fitted and Distributed large thumb spica wrist splint from clinic stock to patient   Assessment & Plan:  A-thumb sprain right   P- Wear splint 24/7 except to shower and rom exercises this week.  Has reusable ice pack 15 minutes BID-TID and aleve 220-440mg  po BID at home take with food  Follow up in 2 weeks for re-evaluation sooner if worsening.  Continue splint wear left hand/thumb, at home may remove for gentle range of motion & shower.  Avoid impact to right hand. Consider PT if no improvement with home therapy/splint.  Discussed with patient to ice/elevate TID 15  minutes. [facility] handout on thumb sprain, rehab exercises given to patient. Discussed next week rubber bands extension 5 reps and squeezing tennis ball once a day 5 reps; this week rest. DDx deQuervain tenosynovitis, arthritis  Patient verbalized agreement and understanding of treatment plan and had no further questions at this time.  P2:  ROM, injury prevention

## 2017-10-09 ENCOUNTER — Ambulatory Visit
Admission: RE | Admit: 2017-10-09 | Discharge: 2017-10-09 | Disposition: A | Discharge status: Home / Self Care | Payer: PRIVATE HEALTH INSURANCE | Source: Ambulatory Visit | Attending: Family Medicine | Admitting: Family Medicine

## 2017-10-09 ENCOUNTER — Other Ambulatory Visit: Payer: Self-pay | Admitting: Family Medicine

## 2017-10-09 DIAGNOSIS — S93402A Sprain of unspecified ligament of left ankle, initial encounter: Secondary | ICD-10-CM

## 2017-10-11 DIAGNOSIS — M79672 Pain in left foot: Secondary | ICD-10-CM | POA: Insufficient documentation

## 2017-11-01 DIAGNOSIS — S92353A Displaced fracture of fifth metatarsal bone, unspecified foot, initial encounter for closed fracture: Secondary | ICD-10-CM | POA: Insufficient documentation

## 2017-12-26 ENCOUNTER — Encounter: Payer: Self-pay | Admitting: Registered Nurse

## 2017-12-26 ENCOUNTER — Ambulatory Visit: Payer: Self-pay | Admitting: Registered Nurse

## 2017-12-26 VITALS — BP 122/86 | HR 77 | Temp 98.6°F

## 2017-12-26 DIAGNOSIS — J019 Acute sinusitis, unspecified: Secondary | ICD-10-CM

## 2017-12-26 DIAGNOSIS — R21 Rash and other nonspecific skin eruption: Secondary | ICD-10-CM

## 2017-12-26 MED ORDER — PREDNISONE 10 MG (21) PO TBPK: ORAL_TABLET | ORAL | 0 refills | Status: DC

## 2017-12-26 MED ORDER — DIPHENHYDRAMINE HCL 2 % EX GEL: 1.0000 "application " | Freq: Two times a day (BID) | CUTANEOUS | 0 refills | Status: AC | PRN

## 2017-12-26 MED ORDER — HYDROCORTISONE 1 % EX LOTN: 1.0000 "application " | TOPICAL_LOTION | Freq: Two times a day (BID) | CUTANEOUS | 0 refills | Status: AC

## 2017-12-26 MED ORDER — SALINE SPRAY 0.65 % NA SOLN: 2.0000 | NASAL | 0 refills | Status: DC

## 2017-12-26 MED ORDER — FLUTICASONE PROPIONATE 50 MCG/ACT NA SUSP: 1.0000 | Freq: Two times a day (BID) | NASAL | 6 refills | Status: DC

## 2017-12-26 NOTE — Patient Instructions (Addendum)
Nonallergic Rhinitis  1. Nonallergic rhinitis is a term used by allergist to describe inflammation in the nose that is not due to an allergic source (i.e., pollens, mold, animal dander or dust mites). 2. Nonallergic rhinitis can mimic many of the symptoms caused by allergies.  This includes a runny nose ("rhinorrhea"), sneezing, congestion, ear fullness and post nasal drip. 3. These symptoms usually occur year round but can be made worse by many environmental factors including weather change, irritants such as cigarette smoke, fumes, perfumes, detergents and many others. These are not allergens, but are irritants, and do not cause the formation of antibodies like true allergens.  For this reason most people with nonallergic rhinitis have negative skin tests. 4. Unfortunately, we have a poor understanding of what causes nonallergic rhinitis but certain factors such as deviated septum, sinusitis and nasal polyps may contribute to the symptoms.  Due to the poor understanding of what causes nonallergic rhinitis it cannot be cured and our treatment is only symptomatic to control symptoms. 5. Important factors in the successful treatment of nonallergic rhinitis include avoidance of the irritants that cause symptoms; for example, people who continue to smoke may never improve.  Continuous therapy works better than intermittent.  This may mean taking medications once daily or 3-4 times a day. Helpful treatments include nasal saline lavage, nasal ipratropium (Atrovent), nasal steroids, and decongestants.  Pure antihistamines don't seem to work very well.  Immunotherapy (allergy shots) has no role in the treatment of nonallergic rhinitis.  Several medications may have to be tried before a good combination is found for you.  These medications, in general, are safe for long term use.  Tolerance may develop to the therapeutic effects of these medications: Frequently changing between several helpful medications can prevent  this should it occur.Allergic Rhinitis, Adult Allergic rhinitis is an allergic reaction that affects the mucous membrane inside the nose. It causes sneezing, a runny or stuffy nose, and the feeling of mucus going down the back of the throat (postnasal drip). Allergic rhinitis can be mild to severe. There are two types of allergic rhinitis:  Seasonal. This type is also called hay fever. It happens only during certain seasons.  Perennial. This type can happen at any time of the year.  What are the causes? This condition happens when the body's defense system (immune system) responds to certain harmless substances called allergens as though they were germs.  Seasonal allergic rhinitis is triggered by pollen, which can come from grasses, trees, and weeds. Perennial allergic rhinitis may be caused by:  House dust mites.  Pet dander.  Mold spores.  What are the signs or symptoms? Symptoms of this condition include:  Sneezing.  Runny or stuffy nose (nasal congestion).  Postnasal drip.  Itchy nose.  Tearing of the eyes.  Trouble sleeping.  Daytime sleepiness.  How is this diagnosed? This condition may be diagnosed based on:  Your medical history.  A physical exam.  Tests to check for related conditions, such as: ? Asthma. ? Pink eye. ? Ear infection. ? Upper respiratory infection.  Tests to find out which allergens trigger your symptoms. These may include skin or blood tests.  How is this treated? There is no cure for this condition, but treatment can help control symptoms. Treatment may include:  Taking medicines that block allergy symptoms, such as antihistamines. Medicine may be given as a shot, nasal spray, or pill.  Avoiding the allergen.  Desensitization. This treatment involves getting ongoing shots until your body  becomes less sensitive to the allergen. This treatment may be done if other treatments do not help.  If taking medicine and avoiding the allergen  does not work, new, stronger medicines may be prescribed.  Follow these instructions at home:  Find out what you are allergic to. Common allergens include smoke, dust, and pollen.  Avoid the things you are allergic to. These are some things you can do to help avoid allergens: ? Replace carpet with wood, tile, or vinyl flooring. Carpet can trap dander and dust. ? Do not smoke. Do not allow smoking in your home. ? Change your heating and air conditioning filter at least once a month. ? During allergy season:  Keep windows closed as much as possible.  Plan outdoor activities when pollen counts are lowest. This is usually during the evening hours.  When coming indoors, change clothing and shower before sitting on furniture or bedding.  Take over-the-counter and prescription medicines only as told by your health care provider.  Keep all follow-up visits as told by your health care provider. This is important. Contact a health care provider if:  You have a fever.  You develop a persistent cough.  You make whistling sounds when you breathe (you wheeze).  Your symptoms interfere with your normal daily activities. Get help right away if:  You have shortness of breath. Summary  This condition can be managed by taking medicines as directed and avoiding allergens.  Contact your health care provider if you develop a persistent cough or fever.  During allergy season, keep windows closed as much as possible. This information is not intended to replace advice given to you by your health care provider. Make sure you discuss any questions you have with your health care provider. Document Released: 11/16/2000 Document Revised: 03/31/2016 Document Reviewed: 03/31/2016 Elsevier Interactive Patient Education  2018 [employer]. Sinusitis, Adult Sinusitis is soreness and inflammation of your sinuses. Sinuses are hollow spaces in the bones around your face. Your sinuses are located:  Around  your eyes.  In the middle of your forehead.  Behind your nose.  In your cheekbones.  Your sinuses and nasal passages are lined with a stringy fluid (mucus). Mucus normally drains out of your sinuses. When your nasal tissues become inflamed or swollen, the mucus can become trapped or blocked so air cannot flow through your sinuses. This allows bacteria, viruses, and funguses to grow, which leads to infection. Sinusitis can develop quickly and last for 7?10 days (acute) or for more than 12 weeks (chronic). Sinusitis often develops after a cold. What are the causes? This condition is caused by anything that creates swelling in the sinuses or stops mucus from draining, including:  Allergies.  Asthma.  Bacterial or viral infection.  Abnormally shaped bones between the nasal passages.  Nasal growths that contain mucus (nasal polyps).  Narrow sinus openings.  Pollutants, such as chemicals or irritants in the air.  A foreign object stuck in the nose.  A fungal infection. This is rare.  What increases the risk? The following factors may make you more likely to develop this condition:  Having allergies or asthma.  Having had a recent cold or respiratory tract infection.  Having structural deformities or blockages in your nose or sinuses.  Having a weak immune system.  Doing a lot of swimming or diving.  Overusing nasal sprays.  Smoking.  What are the signs or symptoms? The main symptoms of this condition are pain and a feeling of pressure around the  affected sinuses. Other symptoms include:  Upper toothache.  Earache.  Headache.  Bad breath.  Decreased sense of smell and taste.  A cough that may get worse at night.  Fatigue.  Fever.  Thick drainage from your nose. The drainage is often green and it may contain pus (purulent).  Stuffy nose or congestion.  Postnasal drip. This is when extra mucus collects in the throat or back of the nose.  Swelling and  warmth over the affected sinuses.  Sore throat.  Sensitivity to light.  How is this diagnosed? This condition is diagnosed based on symptoms, a medical history, and a physical exam. To find out if your condition is acute or chronic, your health care provider may:  Look in your nose for signs of nasal polyps.  Tap over the affected sinus to check for signs of infection.  View the inside of your sinuses using an imaging device that has a light attached (endoscope).  If your health care provider suspects that you have chronic sinusitis, you may also:  Be tested for allergies.  Have a sample of mucus taken from your nose (nasal culture) and checked for bacteria.  Have a mucus sample examined to see if your sinusitis is related to an allergy.  If your sinusitis does not respond to treatment and it lasts longer than 8 weeks, you may have an MRI or CT scan to check your sinuses. These scans also help to determine how severe your infection is. In rare cases, a bone biopsy may be done to rule out more serious types of fungal sinus disease. How is this treated? Treatment for sinusitis depends on the cause and whether your condition is chronic or acute. If a virus is causing your sinusitis, your symptoms will go away on their own within 10 days. You may be given medicines to relieve your symptoms, including:  Topical nasal decongestants. They shrink swollen nasal passages and let mucus drain from your sinuses.  Antihistamines. These drugs block inflammation that is triggered by allergies. This can help to ease swelling in your nose and sinuses.  Topical nasal corticosteroids. These are nasal sprays that ease inflammation and swelling in your nose and sinuses.  Nasal saline washes. These rinses can help to get rid of thick mucus in your nose.  If your condition is caused by bacteria, you will be given an antibiotic medicine. If your condition is caused by a fungus, you will be given an  antifungal medicine. Surgery may be needed to correct underlying conditions, such as narrow nasal passages. Surgery may also be needed to remove polyps. Follow these instructions at home: Medicines  Take, use, or apply over-the-counter and prescription medicines only as told by your health care provider. These may include nasal sprays.  If you were prescribed an antibiotic medicine, take it as told by your health care provider. Do not stop taking the antibiotic even if you start to feel better. Hydrate and Humidify  Drink enough water to keep your urine clear or pale yellow. Staying hydrated will help to thin your mucus.  Use a cool mist humidifier to keep the humidity level in your home above 50%.  Inhale steam for 10-15 minutes, 3-4 times a day or as told by your health care provider. You can do this in the bathroom while a hot shower is running.  Limit your exposure to cool or dry air. Rest  Rest as much as possible.  Sleep with your head raised (elevated).  Make sure to  get enough sleep each night. General instructions  Apply a warm, moist washcloth to your face 3-4 times a day or as told by your health care provider. This will help with discomfort.  Wash your hands often with soap and water to reduce your exposure to viruses and other germs. If soap and water are not available, use hand sanitizer.  Do not smoke. Avoid being around people who are smoking (secondhand smoke).  Keep all follow-up visits as told by your health care provider. This is important. Contact a health care provider if:  You have a fever.  Your symptoms get worse.  Your symptoms do not improve within 10 days. Get help right away if:  You have a severe headache.  You have persistent vomiting.  You have pain or swelling around your face or eyes.  You have vision problems.  You develop confusion.  Your neck is stiff.  You have trouble breathing. This information is not intended to replace  advice given to you by your health care provider. Make sure you discuss any questions you have with your health care provider. Document Released: 02/21/2005 Document Revised: 10/18/2015 Document Reviewed: 12/17/2014 Elsevier Interactive Patient Education  2018 [employer]. Sinus Rinse What is a sinus rinse? A sinus rinse is a simple home treatment that is used to rinse your sinuses with a sterile mixture of salt and water (saline solution). Sinuses are air-filled spaces in your skull behind the bones of your face and forehead that open into your nasal cavity. You will use the following:  Saline solution.  Neti pot or spray bottle. This releases the saline solution into your nose and through your sinuses. Neti pots and spray bottles can be purchased at [employer], a health food store, or online.  When would I do a sinus rinse? A sinus rinse can help to clear mucus, dirt, dust, or pollen from the nasal cavity. You may do a sinus rinse when you have a cold, a virus, nasal allergy symptoms, a sinus infection, or stuffiness in the nose or sinuses. If you are considering a sinus rinse:  Ask your child's health care provider before performing a sinus rinse on your child.  Do not do a sinus rinse if you have had ear or nasal surgery, ear infection, or blocked ears.  How do I do a sinus rinse?  Wash your hands.  Disinfect your device according to the directions provided and then dry it.  Use the solution that comes with your device or one that is sold separately in stores. Follow the mixing directions on the package.  Fill your device with the amount of saline solution as directed by the device instructions.  Stand over a sink and tilt your head sideways over the sink.  Place the spout of the device in your upper nostril (the one closer to the ceiling).  Gently pour or squeeze the saline solution into the nasal cavity. The liquid should drain to the lower nostril if you are not  overly congested.  Gently blow your nose. Blowing too hard may cause ear pain.  Repeat in the other nostril.  Clean and rinse your device with clean water and then air-dry it. Are there risks of a sinus rinse? Sinus rinse is generally very safe and effective. However, there are a few risks, which include:  A burning sensation in the sinuses. This may happen if you do not make the saline solution as directed. Make sure to follow all directions when making  the saline solution.  Infection from contaminated water. This is rare, but possible.  Nasal irritation.  This information is not intended to replace advice given to you by your health care provider. Make sure you discuss any questions you have with your health care provider. Document Released: 09/18/2013 Document Revised: 01/19/2016 Document Reviewed: 07/09/2013 Elsevier Interactive Patient Education  2017 [employer] Contact Dermatitis Dermatitis is redness, soreness, and swelling (inflammation) of the skin. Contact dermatitis is a reaction to certain substances that touch the skin. There are two types of contact dermatitis:  Irritant contact dermatitis. This type is caused by something that irritates your skin, such as dry hands from washing them too much. This type does not require previous exposure to the substance for a reaction to occur. This type is more common.  Allergic contact dermatitis. This type is caused by a substance that you are allergic to, such as a nickel allergy or poison ivy. This type only occurs if you have been exposed to the substance (allergen) before. Upon a repeat exposure, your body reacts to the substance. This type is less common.  What are the causes? Many different substances can cause contact dermatitis. Irritant contact dermatitis is most commonly caused by exposure to:  Makeup.  Soaps.  Detergents.  Bleaches.  Acids.  Metal salts, such as nickel.  Allergic contact dermatitis is most  commonly caused by exposure to:  Poisonous plants.  Chemicals.  Jewelry.  Latex.  Medicines.  Preservatives in products, such as clothing.  What increases the risk? This condition is more likely to develop in:  People who have jobs that expose them to irritants or allergens.  People who have certain medical conditions, such as asthma or eczema.  What are the signs or symptoms? Symptoms of this condition may occur anywhere on your body where the irritant has touched you or is touched by you. Symptoms include:  Dryness or flaking.  Redness.  Cracks.  Itching.  Pain or a burning feeling.  Blisters.  Drainage of small amounts of blood or clear fluid from skin cracks.  With allergic contact dermatitis, there may also be swelling in areas such as the eyelids, mouth, or genitals. How is this diagnosed? This condition is diagnosed with a medical history and physical exam. A patch skin test may be performed to help determine the cause. If the condition is related to your job, you may need to see an occupational medicine specialist. How is this treated? Treatment for this condition includes figuring out what caused the reaction and protecting your skin from further contact. Treatment may also include:  Steroid creams or ointments. Oral steroid medicines may be needed in more severe cases.  Antibiotics or antibacterial ointments, if a skin infection is present.  Antihistamine lotion or an antihistamine taken by mouth to ease itching.  A bandage (dressing).  Follow these instructions at home: Skin Care  Moisturize your skin as needed.  Apply cool compresses to the affected areas.  Try taking a bath with: ? Epsom salts. Follow the instructions on the packaging. You can get these at your local pharmacy or grocery store. ? Baking soda. Pour a small amount into the bath as directed by your health care provider. ? Colloidal oatmeal. Follow the instructions on the  packaging. You can get this at your local pharmacy or grocery store.  Try applying baking soda paste to your skin. Stir water into baking soda until it reaches a paste-like consistency.  Do not scratch your skin.  Bathe less frequently, such as every other day.  Bathe in lukewarm water. Avoid using hot water. Medicines  Take or apply over-the-counter and prescription medicines only as told by your health care provider.  If you were prescribed an antibiotic medicine, take or apply your antibiotic as told by your health care provider. Do not stop using the antibiotic even if your condition starts to improve. General instructions  Keep all follow-up visits as told by your health care provider. This is important.  Avoid the substance that caused your reaction. If you do not know what caused it, keep a journal to try to track what caused it. Write down: ? What you eat. ? What cosmetic products you use. ? What you drink. ? What you wear in the affected area. This includes jewelry.  If you were given a dressing, take care of it as told by your health care provider. This includes when to change and remove it. Contact a health care provider if:  Your condition does not improve with treatment.  Your condition gets worse.  You have signs of infection such as swelling, tenderness, redness, soreness, or warmth in the affected area.  You have a fever.  You have new symptoms. Get help right away if:  You have a severe headache, neck pain, or neck stiffness.  You vomit.  You feel very sleepy.  You notice red streaks coming from the affected area.  Your bone or joint underneath the affected area becomes painful after the skin has healed.  The affected area turns darker.  You have difficulty breathing. This information is not intended to replace advice given to you by your health care provider. Make sure you discuss any questions you have with your health care provider. Document  Released: 02/19/2000 Document Revised: 07/30/2015 Document Reviewed: 07/09/2014 Elsevier Interactive Patient Education  2018 [employer].  Oral Mucositis Oral mucositis is a mouth condition that may develop from treatments for cancer. With this condition, sores may appear on your lips, gums, tongue, throat, and the top (roof) or bottom (floor) of your mouth. What are the causes? Oral mucositis can happen to anyone who is being treated with cancer therapies, including:  Cancer medicines (chemotherapy).  Radiation therapy.  Bone marrow transplants and stem cell transplants.  Cancer treatments can damage the lining of the mouth, and that causes this condition. Oral mucositis is not caused by infection. However, the sores can become infected after they form. Infection can make oral mucositis worse. What increases the risk? This condition is more likely to develop in people:  With poor oral hygiene.  With dental problems or oral diseases.  Who use any tobacco product, including cigarettes, chewing tobacco, and electronic cigarettes.  Who drink alcohol.  Who have other medical conditions, such as diabetes, HIV, AIDS, or kidney disease.  Who do not drink enough clear fluids.  Who wear dentures that do not fit correctly.  Who have cancers that primarily affect the blood.  Who are female.  What are the signs or symptoms? Symptoms can vary from mild to severe. Symptoms are usually seen 7-10 days after cancer treatment has started. Symptoms include:  Mouth sores. These sores may bleed.  Color changes inside the mouth. Red, shiny areas may appear.  White patches or pus in the mouth.  Pain in the mouth and throat. This can make it painful to speak.  Dryness and a burning feeling in the mouth.  Saliva that is dry and thick.  Trouble eating, drinking, and  swallowing. This can lead to weight loss.  How is this diagnosed? This condition can be diagnosed with a physical  exam. How is this treated? Treatment depends on the severity of the condition. Oral mucositis often heals on its own. Sometimes, changes in the cancer treatment can help. Treatment may include medicines, such as:  An antibiotic medicine to fight infection, if present.  Medicine to help the cells in your mouth heal more quickly.  Medicine may also be given to help control pain. This may include:  Pain relievers that are swished around in the mouth. These make the mouth numb to ease the pain (topical anesthetics).  Mouth rinses.  Prescribed, medicated gels. The gel coats the mouth. This protects nerve endings and lessens pain.  Narcotic pain medicines.  Follow these instructions at home: Medicines  Do not use products that contain benzocaine (including numbing gels) to treat teething or mouth pain in children who are younger than 2 years. These products may cause a rare but serious blood condition.  If you were prescribed an antibiotic medicine, finish all of it even if you start to feel better.  Take or apply medicines only as directed by your health care provider. Lifestyle  Keeping your mouth clean and germ-free is important. To maintain good oral hygiene: ? Brush your teeth carefully with a soft, nylon-bristled toothbrush at least two times each day. Use a gentle toothpaste. Ask your health care provider for a toothpaste recommendation. ? Floss your teeth every day. ? Have your teeth cleaned regularly as recommended by your dentist. ? Rinse your mouth after every meal or as directed by your health care provider. Do not use mouthwash that contains alcohol. Ask your health care provider for a mouthwash recommendation.  Do not use any tobacco products, including cigarettes, chewing tobacco, and electronic cigarettes. If you need help quitting, ask your health care provider.  Avoid eating: ? Hot and spicy foods. ? Citrus. ? Foods that have sharp edges, such as chips. ? Sugary  foods, such as candy.  Do not drink alcohol. General instructions  Clean the sores as directed by your health care provider.  If your lips are dry or cracked, apply a water-based moisturizer to your lips as needed.  Try sucking on ice chips or sugar-free frozen pops. This may help with pain. This also keeps your mouth moist.  Drink enough fluid to keep your urine clear or pale yellow.  If you are losing weight, talk with your health care provider.  If you have dentures, take them out often as directed by your health care provider.  Keep all follow-up visits as directed by your health care provider. This is important. Contact a health care provider if:  You have mouth pain or throat pain.  You are having more trouble swallowing.  Your symptoms get worse.  You have new symptoms.  Your pain is not controlled with medicine. Get help right away if:  You have a lot of bleeding in your mouth.  You have trouble speaking.  You develop new, open, or draining sores in your mouth.  You cannot swallow solid food or liquids.  You have a fever. This information is not intended to replace advice given to you by your health care provider. Make sure you discuss any questions you have with your health care provider. Document Released: 10/08/2010 Document Revised: 07/29/2016 Document Reviewed: 02/17/2014 Elsevier Interactive Patient Education  2018 [employer].  Smoking Tobacco Information Smoking tobacco will very likely harm your  health. Tobacco contains a poisonous (toxic), colorless chemical called nicotine. Nicotine affects the brain and makes tobacco addictive. This change in your brain can make it hard to stop smoking. Tobacco also has other toxic chemicals that can hurt your body and raise your risk of many cancers. How can smoking tobacco affect me? Smoking tobacco can increase your chances of having serious health conditions, such as:  Cancer. Smoking is most commonly  associated with lung cancer, but can lead to cancer in other parts of the body.  Chronic obstructive pulmonary disease (COPD). This is a long-term lung condition that makes it hard to breathe. It also gets worse over time.  High blood pressure (hypertension), heart disease, stroke, or heart attack.  Lung infections, such as pneumonia.  Cataracts. This is when the lenses in the eyes become clouded.  Digestive problems. This may include peptic ulcers, heartburn, and gastroesophageal reflux disease (GERD).  Oral health problems, such as gum disease and tooth loss.  Loss of taste and smell.  Smoking can affect your appearance by causing:  Wrinkles.  Yellow or stained teeth, fingers, and fingernails.  Smoking tobacco can also affect your social life.  Many workplaces, [employer], hotels, and public places are tobacco-free. This means that you may experience challenges in finding places to smoke when away from home.  The cost of a smoking habit can be expensive. Expenses for someone who smokes come in two ways: ? You spend money on a regular basis to buy tobacco. ? Your health care costs in the long-term are higher if you smoke.  Tobacco smoke can also affect the health of those around you. Children of smokers have greater chances of: ? Sudden infant death syndrome (SIDS). ? Ear infections. ? Lung infections.  What lifestyle changes can be made?  Do not start smoking. Quit if you already do.  To quit smoking: ? Make a plan to quit smoking and commit yourself to it. Look for programs to help you and ask your health care provider for recommendations and ideas. ? Talk with your health care provider about using nicotine replacement medicines to help you quit. Medicine replacement medicines include gum, lozenges, patches, sprays, or pills. ? Do not replace cigarette smoking with electronic cigarettes, which are commonly called e-cigarettes. The safety of e-cigarettes is not known, and  some may contain harmful chemicals. ? Avoid places, people, or situations that tempt you to smoke. ? If you try to quit but return to smoking, don't give up hope. It is very common for people to try a number of times before they fully succeed. When you feel ready again, give it another try.  Quitting smoking might affect the way you eat as well as your weight. Be prepared to monitor your eating habits. Get support in planning and following a healthy diet.  Ask your health care provider about having regular tests (screenings) to check for cancer. This may include blood tests, imaging tests, and other tests.  Exercise regularly. Consider taking walks, joining a gym, or doing yoga or exercise classes.  Develop skills to manage your stress. These skills include meditation. What are the benefits of quitting smoking? By quitting smoking, you may:  Lower your risk of getting cancer and other diseases caused by smoking.  Live longer.  Breathe better.  Lower your blood pressure and heart rate.  Stop your addiction to tobacco.  Stop creating secondhand smoke that hurts other people.  Improve your sense of taste and smell.  Look better over  time, due to having fewer wrinkles and less staining.  What can happen if changes are not made? If you do not stop smoking, you may:  Get cancer and other diseases.  Develop COPD or other long-term (chronic) lung conditions.  Develop serious problems with your heart and blood vessels (cardiovascular system).  Need more tests to screen for problems caused by smoking.  Have higher, long-term healthcare costs from medicines or treatments related to smoking.  Continue to have worsening changes in your lungs, mouth, and nose.  Where to find support: To get support to quit smoking, consider:  Asking your health care provider for more information and resources.  Taking classes to learn more about quitting smoking.  Looking for local organizations  that offer resources about quitting smoking.  Joining a support group for people who want to quit smoking in your local community.  Where to find more information: You may find more information about quitting smoking from:  HelpGuide.org: www.helpguide.org/articles/addictions/how-to-quit-smoking.htm  BankRights.uy: smokefree.gov  American Lung Association: www.lung.org  Contact a health care provider if:  You have problems breathing.  Your lips, nose, or fingers turn blue.  You have chest pain.  You are coughing up blood.  You feel faint or you pass out.  You have other noticeable changes that cause you to worry. Summary  Smoking tobacco can negatively affect your health, the health of those around you, your finances, and your social life.  Do not start smoking. Quit if you already do. If you need help quitting, ask your health care provider.  Think about joining a support group for people who want to quit smoking in your local community. There are many effective programs that will help you to quit this behavior. This information is not intended to replace advice given to you by your health care provider. Make sure you discuss any questions you have with your health care provider. Document Released: 03/08/2016 Document Revised: 03/08/2016 Document Reviewed: 03/08/2016 Elsevier Interactive Patient Education  [employer].

## 2017-12-26 NOTE — Progress Notes (Signed)
Subjective:    Patient ID: Sydney Spencer, female    DOB: 1975/07/02, 42 y.o.   MRN: [id number]  42y/o Caucasian single smoker established female pt c/o maxillary and frontal sinus pain/pressure x4 days, rhinorrhea, nasal congestion, cough and chest congestion. Also with bilateral otalgia R>L with ear pressure and muffled hearing. Denies ear drainage. Using Sudafed PE and Afrin spray (once daily since Friday) at home with some relief. 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  HENT: Positive for congestion, dental problem, ear pain, hearing loss, mouth sores, postnasal drip, rhinorrhea, sinus pressure and sinus pain. Negative for drooling, ear discharge, facial swelling, nosebleeds, sneezing, sore throat, tinnitus, trouble swallowing and voice change.   Eyes: Negative for photophobia, pain, discharge, redness, itching and visual disturbance.  Respiratory: Positive for cough. Negative for choking, chest tightness, shortness of breath, wheezing and stridor.   Cardiovascular: Negative for chest pain, palpitations and leg swelling.  Gastrointestinal: Negative for abdominal distention, abdominal pain, diarrhea, nausea and vomiting.  Endocrine: Negative for cold intolerance and heat intolerance.  Genitourinary: Negative for difficulty urinating, dysuria and hematuria.  Musculoskeletal:  Negative for arthralgias, back pain, gait problem, joint swelling, myalgias, neck pain and neck stiffness.  Skin: Positive for color change and rash. Negative for pallor and wound.  Allergic/Immunologic: Positive for environmental allergies. Negative for food allergies.  Neurological: Negative for dizziness, tremors, seizures, syncope, facial asymmetry, speech difficulty, weakness, light-headedness, numbness and headaches.  Hematological: Negative for adenopathy. Does not bruise/bleed easily.  Psychiatric/Behavioral: Negative for agitation, behavioral problems, confusion and sleep disturbance.       Objective:   Physical Exam  Constitutional: She is oriented to person, place, and time. She appears well-developed and well-nourished. She is active and cooperative.  Non-toxic appearance. She does not have a sickly appearance. She appears ill. No distress.  HENT:  Head: Normocephalic and atraumatic.  Right Ear: Hearing, external ear and ear canal normal. Tympanic membrane is scarred. A middle ear effusion is present.  Left Ear: Hearing, external ear and ear canal normal. Tympanic membrane is scarred. A middle ear effusion is present.  Nose: Mucosal edema and rhinorrhea present. No nose lacerations, sinus tenderness, nasal deformity, septal deviation or nasal septal hematoma. No epistaxis.  No foreign bodies. Right sinus exhibits no maxillary sinus tenderness and no frontal sinus tenderness. Left sinus exhibits no maxillary sinus tenderness and no frontal sinus tenderness.  Mouth/Throat: Uvula is midline and mucous membranes are normal. Mucous membranes are not pale, not dry and not cyanotic. She does not have dentures. Oral lesions present. No trismus in the jaw. Normal dentition. No dental abscesses, uvula swelling, lacerations or dental caries. Posterior oropharyngeal edema and posterior oropharyngeal erythema present. No oropharyngeal exudate or tonsillar abscesses. Tonsils are 1+ on the right.  Tonsils are 1+ on the left. No tonsillar exudate.    Cobblestoning posterior pharynx; bilateral TMs air fluid level clear with scarring; bilateral allergic  shiners; clear discharge bilateral nasal turbinates with edema/erythema  Eyes: Pupils are equal, round, and reactive to light. Conjunctivae, EOM and lids are normal. Right eye exhibits no chemosis, no discharge, no exudate and no hordeolum. No foreign body present in the right eye. Left eye exhibits no chemosis, no discharge, no exudate and no hordeolum. No foreign body present in the left eye. Right conjunctiva is not injected. Right conjunctiva has no hemorrhage. Left conjunctiva is not injected. Left conjunctiva has no hemorrhage. No scleral icterus. Right eye exhibits normal extraocular motion and no nystagmus. Left eye exhibits normal extraocular motion and no nystagmus. Right pupil is round and reactive. Left pupil is round and reactive. Pupils are equal.  Neck: Trachea normal, normal range of motion and phonation normal. Neck supple. No tracheal tenderness, no spinous process tenderness and no muscular tenderness present. No neck rigidity. No tracheal deviation, no edema, no erythema and normal range of motion present. No thyroid mass and no thyromegaly present.  Cardiovascular: Normal rate, regular rhythm, S1 normal, S2 normal, normal heart sounds and intact distal pulses. PMI is not displaced. Exam reveals no gallop and no friction rub.  No murmur heard. Pulmonary/Chest: Effort normal. No accessory muscle usage or stridor. No respiratory distress. She has no decreased breath sounds. She has wheezes in the right middle field, the right lower field, the left middle field and the left lower field. She has no rhonchi. She has no rales. She exhibits no tenderness.  Expiratory wheeze with deep breathing auscultated; spoke full sentences without difficulty; no coughing observed in exam room; smells of tobacco smoke on skin and clothes  Abdominal:  Soft. She exhibits no distension.  Musculoskeletal: Normal range of motion. She exhibits no edema or tenderness.       Right shoulder: Normal.       Left shoulder: Normal.       Right hip: Normal.       Left hip: Normal.       Right knee: Normal.       Left knee: Normal.       Cervical back: Normal.       Right hand: Normal.       Left hand: Normal.  Lymphadenopathy:       Head (right side): No submental, no submandibular, no tonsillar, no preauricular, no posterior auricular and no occipital adenopathy present.       Head (left side): No submental, no submandibular, no tonsillar, no preauricular, no posterior auricular and no occipital adenopathy present.    She has no cervical adenopathy.       Right cervical: No superficial cervical, no deep cervical and no posterior cervical adenopathy present.      Left cervical: No superficial cervical, no deep cervical and no posterior cervical adenopathy present.  Neurological: She is alert and oriented to person, place, and time. She has normal strength. She is not disoriented. She displays no atrophy and no tremor. No cranial nerve deficit or sensory deficit. She exhibits normal muscle tone. She displays no seizure activity. Coordination and gait normal. GCS eye subscore is 4. GCS verbal subscore is 5. GCS motor subscore is 6.  Skin: Skin is warm and dry. Capillary refill takes less than 2 seconds. Abrasion and rash noted. No bruising, no burn, no ecchymosis, no laceration, no lesion and no petechiae noted. Rash is macular, papular and maculopapular. She is not diaphoretic. No cyanosis or erythema. No pallor. Nails show no clubbing.     Psychiatric: She has  a normal mood and affect. Her speech is normal and behavior is normal. Judgment and thought content normal. Cognition and memory are normal.  Nursing note and vitals reviewed.         Assessment & Plan:  A-acute Bronchitis, acute rhinosinutis, rash, oral lesion, tobacco abuse  P- may use  OTC robitussin/delsym or nyquil per manufacturer's instructions prn cough.  Cough lozenges po q2h prn cough given 8 UD from clinic stock.  Prednisone taper 10mg  (60/50/40/30/20/10mg ) po daily with breakfast #21 RF0 dispensed from [facility].  Discussed possible side effects increased/decreased appetite, difficulty sleeping, increased blood sugar, increased blood pressure and heart rate.  Albuterol MDI <MEASUREMENWorld Fuel Services Corpora587[place] [id number]- B26runKoreasRWardell HAdventhealth CMarland KitcheUniversity Of [id number] [employer]a985Nch Healthcare [phone]pi31tal RNKoreaasWardell HAce Endoscopy And SuMarland KitcheMemorial Hermann Memorial [phone] [phone] HState HillMarland [phone] [id number] [place] [facility] H20osKoreap-Wardell HDavita Medical [facility] Dba Digestive Disease EndoMarland KitcheUniversity Hospitals Avon [phone] [phone], [facility], O1dyssRSilKoreaveWardell HLakeside Milam RecMarland KitchePuget Sound Gastroetnerology At [phone] [employer]619[facility] [phone]osp67italRGreenpKoreaorWardell HMenlo Park SurgerMarland [phone] [phone] Me7morKoreaiRWardell HWarm Springs MeMarland KitcheComanche [phone] [id number] And Vascular Surg30icaKorealRWardell HParis SurgerMarland KitcheRed Bud Illinois Co LLC Dba Red Bud [phone] [employer]([phone] [id number] Purchase7 MeKoreadRWardell HCarrillo SuMarland KitcheWest [phone] [employer]([phone] [id number] Ort75hopaRCKorealaWardell HClinton County OutpatientMarland KitcheWooster [phone] [id number] Hosp3italRKoreatWardell HChildrens Hospital Of Marland KitcheGastroenterology [phone] [id number] Of 29SoutRPinKoreae Wardell HAlta Bates Summit Med Ctr-HeMarland KitcheSt. Bernards [phone] [phone] 6Brazospor46t EyRMaplewKoreaooWardell HCrystal Run AmbulaMarland [phone] [employer] [id number] 377 8Ucsf Medical Cente20r AtRWiKoreancWardell HShriners Hospitals For ChildreMarland KitcheMacon [phone] [employer] [id number] PWardell HChenango MemorMarland KitcheBroward [id number] [employer] [phone] SurRBelloKoreawsWardell HBeckley Marland KitcheEnloe Medical Center-[phone] [id number] Medical C21enKoreateWardell HCommunity Hospital Of Anderson And MaMarland KitcheMemorial Hermann [facility] Dba [phone] [employer]956Endo Gro([phone] RTierrKoreaa Wardell HThree RMarland KitcheSaint Thomas [phone] [id number] MedKoreaRRWardell HProvidence Willamette Falls MeMarland KitcheEl Paso [phone] [employer] Corpor([phone] Health31 KoreaysWardell HFreeman NeoMarland KitchePort [phone] Hospitalrial Healthtracted cough/wheeze has at home probable side effect increased heart rate. Bronchitis simple, community acquired, may have started as viral (probably respiratory syncytial, parainfluenza, influenza, or adenovirus), but now evidence of acute purulent bronchitis with resultant bronchial edema and mucus formation.  Viruses are the most common cause of bronchial inflammation in otherwise healthy adults with acute bronchitis.  The appearance of sputum is not predictive of whether a bacterial infection is present.  Purulent sputum is most often caused by viral infections.  There are a small portion of those caused by non-viral agents being Mycoplama pneumonia.  Microscopic examination or C&S of sputum in the healthy adult with acute bronchitis is generally not helpful (usually negative or normal respiratory flora) other considerations being cough from upper respiratory tract infections, sinusitis or allergic syndromes (mild asthma or viral pneumonia).  Differential Diagnoses:  reactive airway disease (asthma, allergic aspergillosis (eosinophilia), chronic bronchitis, respiratory infection (sinusitis, common cold, pneumonia), congestive heart failure, reflux esophagitis, bronchogenic tumor, aspiration syndromes and/or exposure to pulmonary irritants/smoke.  Without high fever, severe dyspnea, lack of physical findings or other risk factors, I will hold on a chest radiograph and CBC at this time.  I discussed that approximately 50% of patients with acute bronchitis have a cough that lasts up to three  weeks, and 25% for over a month.  Tylenol 500mg  one to two tablets every four to six hours as needed for fever or myalgias.  No aspirin. Exitcare handout on bronchitis and inhaler use given to patient.  ER if hemopthysis, SOB, worst chest pain of life.   Patient instructed to follow up in one week or sooner if symptoms worsen.  Patient verbalized agreement and understanding of treatment plan.  P2:  hand washing and cover cough  continue claritin 10mg  po daily consider switching to zyrtec 10mg  po BID prn itching or take benadryl 25-50mg  po bedtime and claritin am.   Calagel and hydrocortisone ointment 4 UD given to patient and may alternate left arm along with cryotherapy. calagel 2% topical bid prn itching may alternate with hydrocortisone 1% topical BID prn itching x 2 weeks.  Prednisone taper dispensed from [facility] see above.  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  If no improvement with 2 weeks treatment follow up for re-evaluation also discussed BID emollient application e.g vaseline/aquaphor or eucerin not pump lotion as high alcohol content dries skin further.   Medication as directed. Symptomatic therapy suggested. Warm to cool water soaks and/or oatmeal baths. Call or return to clinic as needed if these symptoms worsen or fail to improve as anticipated. Exitcare handout on dermatitis. Patient verbalized agreement and understanding of treatment plan.  P2: Avoidance and hand washing.   May continue sudafed and mucinex otc prn rhintiis/chest congestion.  Sudafed max 240mg  per 24 hours may cause drowsiness/insomnia/elevated HR/BP  caution with driving avoid alcohol intake with use.  Mucinex can increase coughing recommend during day use not immediately prior to bedtime.  Restart flonase electronic Rx #1 RF6 to her pharmacy of choice.  Patient may use normal saline nasal spray 2 sprays each nostril q2h wa as needed. flonase 1 spray each nostril BID #1 RF0.  Patient denied  personal or family history of ENT cancer.  OTC antihistamine of choice claritin/zyrtec 10mg  po daily.  Avoid triggers if possible.  Shower prior to bedtime if exposed to triggers.  If allergic dust/dust mites recommend mattress/pillow covers/encasements; washing linens, vacuuming, sweeping, dusting weekly.  Call or return to clinic as needed if these symptoms worsen or fail to improve as anticipated.   Exitcare handout on allergic rhinitis and sinus rinse given to patient.  Patient verbalized understanding of instructions, agreed with plan of care and had no further questions at this time.  P2:  Avoidance and hand washing.     Salt water gargles for oral lesion BID 1 tsp in 2 cups warm water not boiling or iced and talk with dentist.  Cut down on smoking consider cessation.  At risk for oral cancers.  Follow up for re-evaluation if purulent discharge, dysphagia, dysphasia, fever, enlarged lymph nodes.  exitcare handout tobacco cessation and oral lesion. Patient verbalized understanding information/instructions, agreed with plan of care and had no further questions at this time.

## 2018-01-08 ENCOUNTER — Telehealth: Payer: Self-pay | Admitting: *Deleted

## 2018-01-08 NOTE — Telephone Encounter (Signed)
Pt reports having an infected tooth that she is now awaiting surgery for. She sts she has been on Amoxicillin 500mg  QID for 2 weeks. Will be on abx for at least another week until oral surgery.  Sts she has been eating yogurt daily trying to prevent vaginal yeast infection that she often ends up with after abx use of more than 4-5 days. Reports since yesterday she has begun to have sx of vaginal itching, burning, and d/c, similar to her previous treated yeast infections 2/2 abx use. Requesting Diflucan to treat. Confirms preferred pharmacy [facility] [address]

## 2018-01-09 ENCOUNTER — Encounter: Payer: Self-pay | Admitting: *Deleted

## 2018-01-09 MED ORDER — FLUCONAZOLE 150 MG PO TABS: ORAL_TABLET | ORAL | 0 refills | Status: AC

## 2018-01-09 NOTE — Telephone Encounter (Signed)
Patient has utilized diflucan 150mg  po x 1 now and may repeat in 72 hours if needed previously without side effects new Rx entered for patient electronically to her pharmacy of choice.

## 2018-03-08 ENCOUNTER — Encounter: Payer: Self-pay | Admitting: Registered Nurse

## 2018-03-08 ENCOUNTER — Ambulatory Visit: Payer: Self-pay | Admitting: Registered Nurse

## 2018-03-08 VITALS — BP 106/78 | HR 68 | Temp 98.4°F

## 2018-03-08 DIAGNOSIS — H6692 Otitis media, unspecified, left ear: Secondary | ICD-10-CM

## 2018-03-08 DIAGNOSIS — B370 Candidal stomatitis: Secondary | ICD-10-CM

## 2018-03-08 DIAGNOSIS — J0101 Acute recurrent maxillary sinusitis: Secondary | ICD-10-CM

## 2018-03-08 MED ORDER — AMOXICILLIN-POT CLAVULANATE 875-125 MG PO TABS: 1.0000 | ORAL_TABLET | Freq: Two times a day (BID) | ORAL | 0 refills | Status: DC

## 2018-03-08 MED ORDER — CLOTRIMAZOLE 10 MG MT TROC: 10.0000 mg | Freq: Every day | OROMUCOSAL | 0 refills | Status: AC

## 2018-03-08 MED ORDER — PHENYLEPHRINE HCL 10 MG PO TABS: 5.0000 mg | ORAL_TABLET | Freq: Four times a day (QID) | ORAL | 0 refills | Status: AC | PRN

## 2018-03-08 MED ORDER — FLUCONAZOLE 150 MG PO TABS: ORAL_TABLET | ORAL | 0 refills | Status: DC

## 2018-03-08 NOTE — Patient Instructions (Signed)
Otitis Media, Adult  Otitis media occurs when there is inflammation and fluid in the middle ear. Your middle ear is a part of the ear that contains bones for hearing as well as air that helps send sounds to your brain. What are the causes? This condition is caused by a blockage in the eustachian tube. This tube drains fluid from the ear to the back of the nose (nasopharynx). A blockage in this tube can be caused by an object or by swelling (edema) in the tube. Problems that can cause a blockage include:  A cold or other upper respiratory infection.  Allergies.  An irritant, such as tobacco smoke.  Enlarged adenoids. The adenoids are areas of soft tissue located high in the back of the throat, behind the nose and the roof of the mouth.  A mass in the nasopharynx.  Damage to the ear caused by pressure changes (barotrauma). What are the signs or symptoms? Symptoms of this condition include:  Ear pain.  A fever.  Decreased hearing.  A headache.  Tiredness (lethargy).  Fluid leaking from the ear.  Ringing in the ear. How is this diagnosed? This condition is diagnosed with a physical exam. During the exam your health care provider will use an instrument called an otoscope to look into your ear and check for redness, swelling, and fluid. He or she will also ask about your symptoms. Your health care provider may also order tests, such as:  A test to check the movement of the eardrum (pneumatic otoscopy). This test is done by squeezing a small amount of air into the ear.  A test that changes air pressure in the middle ear to check how well the eardrum moves and whether the eustachian tube is working (tympanogram). How is this treated? This condition usually goes away on its own within 3-5 days. But if the condition is caused by a bacteria infection and does not go away own its own, or keeps coming back, your health care provider may:  Prescribe antibiotic medicines to treat the  infection.  Prescribe or recommend medicines to control pain. Follow these instructions at home:  Take over-the-counter and prescription medicines only as told by your health care provider.  If you were prescribed an antibiotic medicine, take it as told by your health care provider. Do not stop taking the antibiotic even if you start to feel better.  Keep all follow-up visits as told by your health care provider. This is important. Contact a health care provider if:  You have bleeding from your nose.  There is a lump on your neck.  You are not getting better in 5 days.  You feel worse instead of better. Get help right away if:  You have severe pain that is not controlled with medicine.  You have swelling, redness, or pain around your ear.  You have stiffness in your neck.  A part of your face is paralyzed.  The bone behind your ear (mastoid) is tender when you touch it.  You develop a severe headache. Summary  Otitis media is redness, soreness, and swelling of the middle ear.  This condition usually goes away on its own within 3-5 days.  If the problem does not go away in 3-5 days, your health care provider may prescribe or recommend medicines to treat your symptoms.  If you were prescribed an antibiotic medicine, take it as told by your health care provider. This information is not intended to replace advice given to you by   your health care provider. Make sure you discuss any questions you have with your health care provider. Document Released: 11/27/2003 Document Revised: 02/12/2016 Document Reviewed: 02/12/2016 Elsevier Interactive Patient Education  2019 [employer] Sinusitis, Adult Sinusitis is inflammation of your sinuses. Sinuses are hollow spaces in the bones around your face. Your sinuses are located:  Around your eyes.  In the middle of your forehead.  Behind your nose.  In your cheekbones. Mucus normally drains out of your sinuses. When your nasal  tissues become inflamed or swollen, mucus can become trapped or blocked. This allows bacteria, viruses, and fungi to grow, which leads to infection. Most infections of the sinuses are caused by a virus. Sinusitis can develop quickly. It can last for up to 4 weeks (acute) or for more than 12 weeks (chronic). Sinusitis often develops after a cold. What are the causes? This condition is caused by anything that creates swelling in the sinuses or stops mucus from draining. This includes:  Allergies.  Asthma.  Infection from bacteria or viruses.  Deformities or blockages in your nose or sinuses.  Abnormal growths in the nose (nasal polyps).  Pollutants, such as chemicals or irritants in the air.  Infection from fungi (rare). What increases the risk? You are more likely to develop this condition if you:  Have a weak body defense system (immune system).  Do a lot of swimming or diving.  Overuse nasal sprays.  Smoke. What are the signs or symptoms? The main symptoms of this condition are pain and a feeling of pressure around the affected sinuses. Other symptoms include:  Stuffy nose or congestion.  Thick drainage from your nose.  Swelling and warmth over the affected sinuses.  Headache.  Upper toothache.  A cough that may get worse at night.  Extra mucus that collects in the throat or the back of the nose (postnasal drip).  Decreased sense of smell and taste.  Fatigue.  A fever.  Sore throat.  Bad breath. How is this diagnosed? This condition is diagnosed based on:  Your symptoms.  Your medical history.  A physical exam.  Tests to find out if your condition is acute or chronic. This may include: ? Checking your nose for nasal polyps. ? Viewing your sinuses using a device that has a light (endoscope). ? Testing for allergies or bacteria. ? Imaging tests, such as an MRI or CT scan. In rare cases, a bone biopsy may be done to rule out more serious types of  fungal sinus disease. How is this treated? Treatment for sinusitis depends on the cause and whether your condition is chronic or acute.  If caused by a virus, your symptoms should go away on their own within 10 days. You may be given medicines to relieve symptoms. They include: ? Medicines that shrink swollen nasal passages (topical intranasal decongestants). ? Medicines that treat allergies (antihistamines). ? A spray that eases inflammation of the nostrils (topical intranasal corticosteroids). ? Rinses that help get rid of thick mucus in your nose (nasal saline washes).  If caused by bacteria, your health care provider may recommend waiting to see if your symptoms improve. Most bacterial infections will get better without antibiotic medicine. You may be given antibiotics if you have: ? A severe infection. ? A weak immune system.  If caused by narrow nasal passages or nasal polyps, you may need to have surgery. Follow these instructions at home: Medicines  Take, use, or apply over-the-counter and prescription medicines only as told by your  health care provider. These may include nasal sprays.  If you were prescribed an antibiotic medicine, take it as told by your health care provider. Do not stop taking the antibiotic even if you start to feel better. Hydrate and humidify   Drink enough fluid to keep your urine pale yellow. Staying hydrated will help to thin your mucus.  Use a cool mist humidifier to keep the humidity level in your home above 50%.  Inhale steam for 10-15 minutes, 3-4 times a day, or as told by your health care provider. You can do this in the bathroom while a hot shower is running.  Limit your exposure to cool or dry air. Rest  Rest as much as possible.  Sleep with your head raised (elevated).  Make sure you get enough sleep each night. General instructions   Apply a warm, moist washcloth to your face 3-4 times a day or as told by your health care provider.  This will help with discomfort.  Wash your hands often with soap and water to reduce your exposure to germs. If soap and water are not available, use hand sanitizer.  Do not smoke. Avoid being around people who are smoking (secondhand smoke).  Keep all follow-up visits as told by your health care provider. This is important. Contact a health care provider if:  You have a fever.  Your symptoms get worse.  Your symptoms do not improve within 10 days. Get help right away if:  You have a severe headache.  You have persistent vomiting.  You have severe pain or swelling around your face or eyes.  You have vision problems.  You develop confusion.  Your neck is stiff.  You have trouble breathing. Summary  Sinusitis is soreness and inflammation of your sinuses. Sinuses are hollow spaces in the bones around your face.  This condition is caused by nasal tissues that become inflamed or swollen. The swelling traps or blocks the flow of mucus. This allows bacteria, viruses, and fungi to grow, which leads to infection.  If you were prescribed an antibiotic medicine, take it as told by your health care provider. Do not stop taking the antibiotic even if you start to feel better.  Keep all follow-up visits as told by your health care provider. This is important. This information is not intended to replace advice given to you by your health care provider. Make sure you discuss any questions you have with your health care provider. Document Released: 02/21/2005 Document Revised: 07/24/2017 Document Reviewed: 07/24/2017 Elsevier Interactive Patient Education  2019 [employer] How to Perform a Sinus Rinse A sinus rinse is a home treatment that is used to rinse your sinuses with a sterile mixture of salt and water (saline solution). Sinuses are air-filled spaces in your skull behind the bones of your face and forehead that open into your nasal cavity. A sinus rinse can help to clear mucus,  dirt, dust, or pollen from your nasal cavity. You may do a sinus rinse when you have a cold, a virus, nasal allergy symptoms, a sinus infection, or stuffiness in your nose or sinuses. Talk with your health care provider about whether a sinus rinse might help you. What are the risks? A sinus rinse is generally safe and effective. However, there are a few risks, which include:  A burning sensation in your sinuses. This may happen if you do not make the saline solution as directed. Be sure to follow all directions when making the saline solution.  Nasal  irritation.  Infection from contaminated water. This is rare, but possible. Do not do a sinus rinse if you have had ear or nasal surgery, ear infection, or blocked ears. Supplies needed:  Saline solution or powder.  Distilled or sterile water may be needed to mix with saline powder. ? You may use boiled and cooled tap water. Boil tap water for 5 minutes; cool until it is lukewarm. Use within 24 hours. ? Do not use regular tap water to mix with the saline solution.  Neti pot or nasal rinse bottle. These supplies release the saline solution into your nose and through your sinuses. Neti pots and nasal rinse bottles can be purchased at [employer], a health food store, or online. How to perform a sinus rinse  1. Wash your hands with soap and water. 2. Wash your device according to the directions that came with the product and then dry it. 3. Use the solution that comes with your product or one that is sold separately in stores. Follow the mixing directions on the package if you need to mix with sterile or distilled water. 4. Fill the device with the amount of saline solution noted in the device instructions. 5. Stand over a sink and tilt your head sideways over the sink. 6. Place the spout of the device in your upper nostril (the one closer to the ceiling). 7. Gently pour or squeeze the saline solution into your nasal cavity. The liquid  should drain out from the lower nostril if you are not too congested. 8. While rinsing, breathe through your open mouth. 9. Gently blow your nose to clear any mucus and rinse solution. Blowing too hard may cause ear pain. 10. Repeat in your other nostril. 11. Clean and rinse your device with clean water and then air-dry it. Talk with your health care provider or pharmacist if you have questions about how to do a sinus rinse. Summary  A sinus rinse is a home treatment that is used to rinse your sinuses with a sterile mixture of salt and water (saline solution).  A sinus rinse is generally safe and effective. Follow all instructions carefully.  Before doing a sinus rinse, talk with your health care provider about whether it would be helpful for you. This information is not intended to replace advice given to you by your health care provider. Make sure you discuss any questions you have with your health care provider. Document Released: 09/18/2013 Document Revised: 12/19/2016 Document Reviewed: 12/19/2016 Elsevier Interactive Patient Education  2019 Else[employer] Inc. Vaginal Yeast infection, Adult  Vaginal yeast infection is a condition that causes vaginal discharge as well as soreness, swelling, and redness (inflammation) of the vagina. This is a common condition. Some women get this infection frequently. What are the causes? This condition is caused by a change in the normal balance of the yeast (candida) and bacteria that live in the vagina. This change causes an overgrowth of yeast, which causes the inflammation. What increases the risk? The condition is more likely to develop in women who:  Take antibiotic medicines.  Have diabetes.  Take birth control pills.  Are pregnant.  Douche often.  Have a weak body defense system (immune system).  Have been taking steroid medicines for a long time.  Frequently wear tight clothing. What are the signs or symptoms? Symptoms of this  condition include:  White, thick, creamy vaginal discharge.  Swelling, itching, redness, and irritation of the vagina. The lips of the vagina (vulva) may be  affected as well.  Pain or a burning feeling while urinating.  Pain during sex. How is this diagnosed? This condition is diagnosed based on:  Your medical history.  A physical exam.  A pelvic exam. Your health care provider will examine a sample of your vaginal discharge under a microscope. Your health care provider may send this sample for testing to confirm the diagnosis. How is this treated? This condition is treated with medicine. Medicines may be over-the-counter or prescription. You may be told to use one or more of the following:  Medicine that is taken by mouth (orally).  Medicine that is applied as a cream (topically).  Medicine that is inserted directly into the vagina (suppository). Follow these instructions at home:  Lifestyle  Do not have sex until your health care provider approves. Tell your sex partner that you have a yeast infection. That person should go to his or her health care provider and ask if they should also be treated.  Do not wear tight clothes, such as pantyhose or tight pants.  Wear breathable cotton underwear. General instructions  Take or apply over-the-counter and prescription medicines only as told by your health care provider.  Eat more yogurt. This may help to keep your yeast infection from returning.  Do not use tampons until your health care provider approves.  Try taking a sitz bath to help with discomfort. This is a warm water bath that is taken while you are sitting down. The water should only come up to your hips and should cover your buttocks. Do this 3-4 times per day or as told by your health care provider.  Do not douche.  If you have diabetes, keep your blood sugar levels under control.  Keep all follow-up visits as told by your health care provider. This is  important. Contact a health care provider if:  You have a fever.  Your symptoms go away and then return.  Your symptoms do not get better with treatment.  Your symptoms get worse.  You have new symptoms.  You develop blisters in or around your vagina.  You have blood coming from your vagina and it is not your menstrual period.  You develop pain in your abdomen. Summary  Vaginal yeast infection is a condition that causes discharge as well as soreness, swelling, and redness (inflammation) of the vagina.  This condition is treated with medicine. Medicines may be over-the-counter or prescription.  Take or apply over-the-counter and prescription medicines only as told by your health care provider.  Do not douche. Do not have sex or use tampons until your health care provider approves.  Contact a health care provider if your symptoms do not get better with treatment or your symptoms go away and then return. This information is not intended to replace advice given to you by your health care provider. Make sure you discuss any questions you have with your health care provider. Document Released: 12/01/2004 Document Revised: 07/10/2017 Document Reviewed: 07/10/2017 Elsevier Interactive Patient Education  2019 Else[employer] Inc. Oral Thrush, Adult  Oral thrush, also called oral candidiasis, is a fungal infection that develops in the mouth and throat and on the tongue. It causes white patches to form on the mouth and tongue. Ginette Pitman is most common in older adults, but it can occur at any age. Many cases of thrush are mild, but this infection can also be serious. Ginette Pitman can be a repeated (recurrent) problem for certain people who have a weak body defense system (immune system).  The weakness can be caused by chronic illnesses, or by taking medicines that limit the body's ability to fight infection. If a person has difficulty fighting infection, the fungus that causes thrush can spread through the  body. This can cause life-threatening blood or organ infections. What are the causes? This condition is caused by a fungus (yeast) called Candida albicans.  This fungus is normally present in small amounts in the mouth and on other mucous membranes. It usually causes no harm.  If conditions are present that allow the fungus to grow without control, it invades surrounding tissues and becomes an infection.  Other Candida species can also lead to thrush (rare). What increases the risk? This condition is more likely to develop in:  People with a weakened immune system.  Older adults.  People with HIV (human immunodeficiency virus).  People with diabetes.  People with dry mouth (xerostomia).  Pregnant women.  People with poor dental care, especially people who have false teeth.  People who use antibiotic medicines. What are the signs or symptoms? Symptoms of this condition can vary from mild and moderate to severe and persistent. Symptoms may include:  A burning feeling in the mouth and throat. This can occur at the start of a thrush infection.  White patches that stick to the mouth and tongue. The tissue around the patches may be red, raw, and painful. If rubbed (during tooth brushing, for example), the patches and the tissue of the mouth may bleed easily.  A bad taste in the mouth or difficulty tasting foods.  A cottony feeling in the mouth.  Pain during eating and swallowing.  Poor appetite.  Cracking at the corners of the mouth. How is this diagnosed? This condition is diagnosed based on:  Physical exam. Your health care provider will look in your mouth.  Health history. Your health care provider will ask you questions about your health. How is this treated? This condition is treated with medicines called antifungals, which prevent the growth of fungi. These medicines are either applied directly to the affected area (topical) or swallowed (oral). The treatment will  depend on the severity of the condition. Mild thrush Mild cases of thrush may clear up with the use of an antifungal mouth rinse or lozenges. Treatment usually lasts about 14 days. Moderate to severe thrush  More severe thrush infections that have spread to the esophagus are treated with an oral antifungal medicine. A topical antifungal medicine may also be used.  For some severe infections, treatment may need to continue for more than 14 days.  Oral antifungal medicines are rarely used during pregnancy because they may be harmful to the unborn child. If you are pregnant, talk with your health care provider about options for treatment. Persistent or recurrent thrush For cases of thrush that do not go away or keep coming back:  Treatment may be needed twice as long as the symptoms last.  Treatment will include both oral and topical antifungal medicines.  People with a weakened immune system can take an antifungal medicine on a continuous basis to prevent thrush infections. It is important to treat conditions that make a person more likely to get thrush, such as diabetes or HIV. Follow these instructions at home: Medicines  Take over-the-counter and prescription medicines only as told by your health care provider.  Talk with your health care provider about an over-the-counter medicine called gentian violet, which kills bacteria and fungi. Relieving soreness and discomfort To help reduce the discomfort of thrush:  Drink cold liquids such as water or iced tea.  Try flavored ice treats or frozen juices.  Eat foods that are easy to swallow, such as gelatin, ice cream, or custard.  Try drinking from a straw if the patches in your mouth are painful.  General instructions  Eat plain, unflavored yogurt as directed by your health care provider. Check the label to make sure the yogurt contains live cultures. This yogurt can help healthy bacteria to grow in the mouth and can stop the growth  of the fungus that causes thrush.  If you wear dentures, remove the dentures before going to bed, brush them vigorously, and soak them in a cleaning solution as directed by your health care provider.  Rinse your mouth with a warm salt-water mixture several times a day. To make a salt-water mixture, completely dissolve 1/2-1 tsp of salt in 1 cup of warm water. Contact a health care provider if:  Your symptoms are getting worse or are not improving within 7 days of starting treatment.  You have symptoms of a spreading infection, such as white patches on the skin outside of the mouth. This information is not intended to replace advice given to you by your health care provider. Make sure you discuss any questions you have with your health care provider. Document Released: 11/17/2003 Document Revised: 11/16/2015 Document Reviewed: 11/16/2015 Elsevier Interactive Patient Education  [employer]2019 [employer]

## 2018-03-08 NOTE — Progress Notes (Signed)
Subjective:    Patient ID: Sydney Spencer, female    DOB: Dec 18, 1975, 43 y.o.   MRN: [id number]  42y/o Caucasian established female pt c/o rhinorrhea, maxillary sinus congestion, PND, itchy ears bilaterally with drainage. +productive cough r/t PND per pt without chest congestion. Using Phenylephrine and Guaifenesin at home with some short-term relief. Has had recent sick contacts while visiting family over the holidays, air travel. Nephews were sick with congestion, rhinorrhea and fevers. Pt denies subjective fevers at home. Has not checked temp. Denies chills. + sick contacts at work viral URI also.  Typically gets vaginal yeast infection with antibiotic use requested diflucan 2 pills typically requires 2 doses to clear vaginal yeast after antibiotics.  Tongue hurting "like I burned it"  "looked like my taste buds had popped/had white heads on them earlier this week"       Review of Systems  Constitutional: Negative for activity change, appetite change, chills, diaphoresis, fatigue, fever and unexpected weight change.  HENT: Positive for congestion, ear pain, mouth sores, postnasal drip, rhinorrhea, sinus pressure, sinus pain and sore throat. Negative for dental problem, drooling, ear discharge, facial swelling, hearing loss, nosebleeds, sneezing, tinnitus, trouble swallowing and voice change.   Eyes: Negative for photophobia, pain, discharge, redness, itching and visual disturbance.  Respiratory: Positive for cough. Negative for choking, chest tightness, shortness of breath, wheezing and stridor.   Cardiovascular: Negative for chest pain, palpitations and leg swelling.  Gastrointestinal: Negative for abdominal distention, abdominal pain, blood in stool, constipation, diarrhea, nausea and vomiting.  Endocrine: Negative for cold intolerance and heat intolerance.  Genitourinary: Negative for difficulty urinating, dysuria and hematuria.  Musculoskeletal: Negative for arthralgias, back pain, gait  problem, joint swelling, myalgias, neck pain and neck stiffness.  Skin: Negative for color change, pallor, rash and wound.  Allergic/Immunologic: Positive for environmental allergies. Negative for food allergies.  Neurological: Positive for headaches. Negative for dizziness, tremors, seizures, syncope, facial asymmetry, speech difficulty, weakness, light-headedness and numbness.  Hematological: Negative for adenopathy. Does not bruise/bleed easily.  Psychiatric/Behavioral: Negative for agitation, behavioral problems, confusion and sleep disturbance.       Objective:   Physical Exam Vitals signs and nursing note reviewed.  Constitutional:      General: She is not in acute distress.    Appearance: Normal appearance. She is well-developed and normal weight. She is ill-appearing. She is not toxic-appearing or diaphoretic.  HENT:     Head: Normocephalic and atraumatic.     Jaw: There is normal jaw occlusion. No trismus, tenderness, swelling, pain on movement or malocclusion.     Salivary Glands: Right salivary gland is not diffusely enlarged or tender. Left salivary gland is not diffusely enlarged or tender.     Right Ear: Hearing, ear canal and external ear normal. A middle ear effusion is present. There is no impacted cerumen. Tympanic membrane is scarred.     Left Ear: Hearing, ear canal and external ear normal. A middle ear effusion is present. There is no impacted cerumen. Tympanic membrane is scarred, erythematous and bulging.     Nose: Nasal tenderness, mucosal edema, congestion and rhinorrhea present. No nasal deformity, septal deviation or laceration.     Right Nostril: No epistaxis or occlusion.     Left Nostril: No epistaxis or occlusion.     Right Sinus: Maxillary sinus tenderness present. No frontal sinus tenderness.     Left Sinus: Maxillary sinus tenderness present. No frontal sinus tenderness.     Mouth/Throat:     Lips: Pink. No  lesions.     Mouth: Mucous membranes are moist.  Mucous membranes are not pale, not dry and not cyanotic. No lacerations, oral lesions or angioedema.     Dentition: Normal dentition. Does not have dentures. No gingival swelling, dental caries, dental abscesses or gum lesions.     Tongue: Lesions present.     Palate: Palate does not elevate in midline.     Pharynx: Uvula midline. Pharyngeal swelling and posterior oropharyngeal erythema present. No oropharyngeal exudate or uvula swelling.     Tonsils: No tonsillar exudate or tonsillar abscesses. Swelling: 0 on the right. 0 on the left.     Comments: Tongue with fissures anterior and distal greatest; bilateral allergic shiners; bilateral maxillary sinuses TTP; left TM erythema 50% bulging air fluid level clear; nasal turbinates edema erythema and yellow discharge  Eyes:     General: Lids are normal. Allergic shiner present. No visual field deficit or scleral icterus.       Right eye: No foreign body, discharge or hordeolum.        Left eye: No foreign body, discharge or hordeolum.     Extraocular Movements: Extraocular movements intact.     Right eye: Normal extraocular motion and no nystagmus.     Left eye: Normal extraocular motion and no nystagmus.     Conjunctiva/sclera: Conjunctivae normal.     Right eye: Right conjunctiva is not injected. No chemosis, exudate or hemorrhage.    Left eye: Left conjunctiva is not injected. No chemosis, exudate or hemorrhage.    Pupils: Pupils are equal, round, and reactive to light. Pupils are equal.     Right eye: Pupil is round and reactive.     Left eye: Pupil is round and reactive.  Neck:     Musculoskeletal: Normal range of motion and neck supple. Normal range of motion. No edema, erythema, neck rigidity, spinous process tenderness or muscular tenderness.     Thyroid: No thyroid mass or thyromegaly.     Trachea: Trachea normal. No tracheal tenderness or tracheal deviation.  Cardiovascular:     Rate and Rhythm: Normal rate and regular rhythm.      Chest Wall: PMI is not displaced.     Heart sounds: Normal heart sounds, S1 normal and S2 normal. No murmur. No friction rub. No gallop.   Pulmonary:     Effort: Pulmonary effort is normal. No accessory muscle usage or respiratory distress.     Breath sounds: Normal breath sounds and air entry. No stridor. No decreased breath sounds, wheezing, rhonchi or rales.     Comments: No cough observed; spoke full sentences without difficulty Chest:     Chest wall: No tenderness.  Abdominal:     General: Abdomen is flat. There is no distension.     Palpations: Abdomen is soft.  Musculoskeletal: Normal range of motion.        General: No tenderness.     Right shoulder: Normal.     Left shoulder: Normal.     Right elbow: Normal.    Left elbow: Normal.     Right hip: Normal.     Left hip: Normal.     Right knee: Normal.     Left knee: Normal.     Cervical back: Normal.     Thoracic back: Normal.     Lumbar back: Normal.     Right hand: Normal.     Left hand: Normal.  Lymphadenopathy:     Head:     Right side  of head: No submental, submandibular, tonsillar, preauricular, posterior auricular or occipital adenopathy.     Left side of head: No submental, submandibular, tonsillar, preauricular, posterior auricular or occipital adenopathy.     Cervical: No cervical adenopathy.     Right cervical: No superficial, deep or posterior cervical adenopathy.    Left cervical: No superficial, deep or posterior cervical adenopathy.  Skin:    General: Skin is warm and dry.     Capillary Refill: Capillary refill takes less than 2 seconds.     Coloration: Skin is not jaundiced or pale.     Findings: No abrasion, bruising, burn, ecchymosis, erythema, laceration, lesion, petechiae or rash.     Nails: There is no clubbing.   Neurological:     General: No focal deficit present.     Mental Status: She is alert and oriented to person, place, and time. Mental status is at baseline. She is not disoriented.      GCS: GCS eye subscore is 4. GCS verbal subscore is 5. GCS motor subscore is 6.     Cranial Nerves: Cranial nerves are intact. No cranial nerve deficit, dysarthria or facial asymmetry.     Sensory: Sensation is intact. No sensory deficit.     Motor: Motor function is intact. No weakness, tremor, atrophy, abnormal muscle tone or seizure activity.     Coordination: Coordination normal.     Gait: Gait normal.     Comments: On/off exam table; in/out of chair without difficulty; gait sure and steady in hallway; upper and lower extremity strength 5/5 equal bilaterally  Psychiatric:        Attention and Perception: Attention and perception normal.        Mood and Affect: Mood and affect normal.        Speech: Speech normal.        Behavior: Behavior normal. Behavior is cooperative.        Thought Content: Thought content normal.        Judgment: Judgment normal.       Fissures on tongue and excoriated; left TM erythema bulging; scarring noted bilaterally; cobblestoning posterior pharynx; bilateral nasal turbinates yellow discharge; bilateral allergic shiners; no cough observed in exam room; spoke full sentences without difficulty    Assessment & Plan:  A-left acute otitis media; oropharyngeal candidiasis, acute recurrent maxillary sinusitis, PMHx vaginal candidiasis with antibiotic use  P-Supportive treatment. Augmentin 875mg  po BID x 10 days #20 RF0 dispensed from [facility] to patient  Tylenol 1000mg  po QID prn pain/fever.   No evidence of invasive bacterial infection, non toxic and well hydrated.  This is most likely self limiting viral infection.  I do not see where any further testing or imaging is necessary at this time.   I will suggest supportive care, rest, good hygiene and encourage the patient to take adequate fluids.  The patient is to return to clinic or EMERGENCY ROOM if symptoms worsen or change significantly e.g. ear pain, fever, purulent discharge from ears or bleeding.  Exitcare handout  on otitis media   Patient verbalized agreement and understanding of treatment plan.    Restart flonase 1 spray each nostril BID refills remaining on original Rx 2019, saline 2 sprays each nostril q2h wa prn congestion.    augmentin 875mg  po BID x 10 days #20 RF0 dispensed from [facility] to patient for otitis media will also cover for bacterial sinusitis but not viral or allergic.  Restart shower before bedtime.  Given 1 bottle nasal saline from  clinic stock and phenylephrine 10mg  po q6h prn rhinitis 4 UD from clinic stock.   Denied personal or family history of ENT cancer.  Shower BID especially prior to bed. No evidence of systemic bacterial infection, non toxic and well hydrated.  I do not see where any further testing or imaging is necessary at this time.   I will suggest supportive care, rest, good hygiene and encourage the patient to take adequate fluids.  The patient is to return to clinic or EMERGENCY ROOM if symptoms worsen or change significantly.  Exitcare handout on sinusitis and sinus rinse.  Patient verbalized agreement and understanding of treatment plan and had no further questions at this time.   P2:  Hand washing and cover cough  Hx vaginal candidiasis with antibiotics electronic Rx diflucan 150mg  po x1 onset symptoms and may repeat in 72 hours #2 RF0 electronic Rx to her phamarcy of choice; restart clotrimazole 10mg  po 5x/day at home for oral thrush x 7 days.  Good oral hygiene, rest, diet healthy fruits/vegetables/meat and dairy.  Consider fermented foods to help digestive system prevent upset.  Follow up if no improvement with plan of care x 48-72 hours.  exitcare handouts vaginal and oral candidiasis.  Patient verbalized understanding information/instructions, agreed with plan of care and had no further questions at this time.;

## 2018-08-23 ENCOUNTER — Encounter: Payer: Self-pay | Admitting: Registered Nurse

## 2018-08-23 ENCOUNTER — Ambulatory Visit: Payer: Self-pay | Admitting: Registered Nurse

## 2018-08-23 ENCOUNTER — Other Ambulatory Visit: Payer: Self-pay

## 2018-08-23 VITALS — BP 114/84 | HR 87 | Temp 98.6°F

## 2018-08-23 DIAGNOSIS — L732 Hidradenitis suppurativa: Secondary | ICD-10-CM

## 2018-08-23 MED ORDER — DOXYCYCLINE HYCLATE 100 MG PO TABS: 100.0000 mg | ORAL_TABLET | Freq: Two times a day (BID) | ORAL | 0 refills | Status: AC

## 2018-08-23 MED ORDER — FLUCONAZOLE 150 MG PO TABS: ORAL_TABLET | ORAL | 0 refills | Status: DC

## 2018-08-23 NOTE — Patient Instructions (Signed)
Hidradenitis Suppurativa  Hidradenitis suppurativa is a long-term (chronic) skin disease. It is similar to a severe form of acne, but it affects areas of the body where acne would be unusual, especially areas of the body where skin rubs against skin and becomes moist. These include:  · Underarms.  · Groin.  · Genital area.  · Buttocks.  · Upper thighs.  · Breasts.  Hidradenitis suppurativa may start out as small lumps or pimples caused by blocked sweat glands or hair follicles. Pimples may develop into deep sores that break open (rupture) and drain pus. Over time, affected areas of skin may thicken and become scarred. This condition is rare and does not spread from person to person (non-contagious).  What are the causes?  The exact cause of this condition is not known. It may be related to:  · Female and female hormones.  · An overactive disease-fighting system (immune system). The immune system may over-react to blocked hair follicles or sweat glands and cause swelling and pus-filled sores.  What increases the risk?  You are more likely to develop this condition if you:  · Are female.  · Are 11-55 years old.  · Have a family history of hidradenitis suppurativa.  · Have a personal history of acne.  · Are overweight.  · Smoke.  · Take the medicine lithium.  What are the signs or symptoms?  The first symptoms are usually painful bumps in the skin, similar to pimples. The condition may get worse over time (progress), or it may only cause mild symptoms. If the disease progresses, symptoms may include:  · Skin bumps getting bigger and growing deeper into the skin.  · Bumps rupturing and draining pus.  · Itchy, infected skin.  · Skin getting thicker and scarred.  · Tunnels under the skin (fistulas) where pus drains from a bump.  · Pain during daily activities, such as pain during walking if your groin area is affected.  · Emotional problems, such as stress or depression. This condition may affect your appearance and your  ability or willingness to wear certain clothes or do certain activities.  How is this diagnosed?  This condition is diagnosed by a health care provider who specializes in skin diseases (dermatologist). You may be diagnosed based on:  · Your symptoms and medical history.  · A physical exam.  · Testing a pus sample for infection.  · Blood tests.  How is this treated?  Your treatment will depend on how severe your symptoms are. The same treatment will not work for everybody with this condition. You may need to try several treatments to find what works best for you. Treatment may include:  · Cleaning and bandaging (dressing) your wounds as needed.  · Lifestyle changes, such as new skin care routines.  · Taking medicines, such as:  ? Antibiotics.  ? Acne medicines.  ? Medicines to reduce the activity of the immune system.  ? A diabetes medicine (metformin).  ? Birth control pills, for women.  ? Steroids to reduce swelling and pain.  · Working with a mental health care provider, if you experience emotional distress due to this condition.  If you have severe symptoms that do not get better with medicine, you may need surgery. Surgery may involve:  · Using a laser to clear the skin and remove hair follicles.  · Opening and draining deep sores.  · Removing the areas of skin that are diseased and scarred.  Follow these instructions at home:    Medicines    · Take over-the-counter and prescription medicines only as told by your health care provider.  · If you were prescribed an antibiotic medicine, take it as told by your health care provider. Do not stop taking the antibiotic even if your condition improves.  Skin care  · If you have open wounds, cover them with a clean dressing as told by your health care provider. Keep wounds clean by washing them gently with soap and water when you bathe.  · Do not shave the areas where you get hidradenitis suppurativa.  · Do not wear deodorant.  · Wear loose-fitting clothes.  · Try to avoid  getting overheated or sweaty. If you get sweaty or wet, change into clean, dry clothes as soon as you can.  · To help relieve pain and itchiness, cover sore areas with a warm, clean washcloth (warm compress) for 5-10 minutes as often as needed.  · If told by your health care provider, take a bleach bath twice a week:  ? Fill your bathtub halfway with water.  ? Pour in ½ cup of unscented household bleach.  ? Soak in the tub for 5-10 minutes.  ? Only soak from the neck down. Avoid water on your face and hair.  ? Shower to rinse off the bleach from your skin.  General instructions  · Learn as much as you can about your disease so that you have an active role in your treatment. Work closely with your health care provider to find treatments that work for you.  · If you are overweight, work with your health care provider to lose weight as recommended.  · Do not use any products that contain nicotine or tobacco, such as cigarettes and e-cigarettes. If you need help quitting, ask your health care provider.  · If you struggle with living with this condition, talk with your health care provider or work with a mental health care provider as recommended.  · Keep all follow-up visits as told by your health care provider. This is important.  Where to find more information  · Hidradenitis Suppurativa [employer].: https://www.hs-foundation.org/  Contact a health care provider if you have:  · A flare-up of hidradenitis suppurativa.  · A fever or chills.  · Trouble controlling your symptoms at home.  · Trouble doing your daily activities because of your symptoms.  · Trouble dealing with emotional problems related to your condition.  Summary  · Hidradenitis suppurativa is a long-term (chronic) skin disease. It is similar to a severe form of acne, but it affects areas of the body where acne would be unusual.  · The first symptoms are usually painful bumps in the skin, similar to pimples. The condition may get worse over time  (progress), or it may only cause mild symptoms.  · If you have open wounds, cover them with a clean dressing as told by your health care provider. Keep wounds clean by washing them gently with soap and water when you bathe.  · Besides skin care, treatment may include medicines, laser treatment, and surgery.  This information is not intended to replace advice given to you by your health care provider. Make sure you discuss any questions you have with your health care provider.  Document Released: 10/06/2003 Document Revised: 03/01/2017 Document Reviewed: 03/01/2017  [employer] Interactive Patient Education © 2019 [employer]

## 2018-08-23 NOTE — Progress Notes (Signed)
Subjective:    Patient ID: Jasmine AweStephanie Ralston, female    DOB: [birth year], 43 y.o.   MRN: [id number]  43y/o Caucasian established female pt c/o small abscess to L armpit with pain, erythema, mild swelling, warmth per pt. First appeared 3 weeks ago and worsened 4-5 days ago. Denies drainage, fever, red streaking, etc. Hx of abscesses that have had to be drained by PCM. She also has another inflamed area on back of left thigh but epsom salt soaks seem to be helping and not as tender.  Uses charcoal scrub and topical clindamycin and loofah.  Epsom salt soaks prn when swollen.  When she needs antibiotics doxycycline has worked well in the past.     Review of Systems  Constitutional: Negative for activity change, appetite change, chills, diaphoresis, fatigue, fever and unexpected weight change.  HENT: Negative for trouble swallowing and voice change.   Eyes: Negative for photophobia and visual disturbance.  Respiratory: Negative for cough, shortness of breath, wheezing and stridor.   Cardiovascular: Negative for chest pain.  Gastrointestinal: Negative for abdominal pain, diarrhea, nausea and vomiting.  Endocrine: Negative for cold intolerance and heat intolerance.  Genitourinary: Negative for difficulty urinating.  Musculoskeletal: Positive for myalgias. Negative for arthralgias, back pain, gait problem, joint swelling, neck pain and neck stiffness.  Skin: Positive for color change and rash. Negative for pallor and wound.  Allergic/Immunologic: Positive for environmental allergies. Negative for food allergies.  Neurological: Negative for dizziness, tremors, seizures, syncope, facial asymmetry, speech difficulty, weakness, light-headedness, numbness and headaches.  Hematological: Negative for adenopathy. Does not bruise/bleed easily.  Psychiatric/Behavioral: Negative for agitation, confusion and sleep disturbance.       Objective:   Physical Exam Vitals signs and nursing note reviewed.   Constitutional:      General: She is awake. She is not in acute distress.    Appearance: Normal appearance. She is well-developed, well-groomed and normal weight. She is not ill-appearing, toxic-appearing or diaphoretic.  HENT:     Head: Normocephalic and atraumatic.     Jaw: There is normal jaw occlusion.     Salivary Glands: Right salivary gland is not diffusely enlarged or tender. Left salivary gland is not diffusely enlarged or tender.     Right Ear: Hearing and external ear normal.     Left Ear: Hearing and external ear normal.     Nose: Nose normal.     Mouth/Throat:     Lips: Pink. No lesions.     Mouth: Mucous membranes are moist.     Pharynx: Oropharynx is clear. Uvula midline.  Eyes:     General: Lids are normal. No visual field deficit.    Extraocular Movements: Extraocular movements intact.     Conjunctiva/sclera: Conjunctivae normal.     Pupils: Pupils are equal, round, and reactive to light.  Neck:     Musculoskeletal: Normal range of motion and neck supple. Normal range of motion. No edema, erythema, neck rigidity, crepitus, injury, pain with movement, torticollis or muscular tenderness.     Vascular: No carotid bruit.     Trachea: Trachea normal.  Cardiovascular:     Rate and Rhythm: Normal rate and regular rhythm.     Pulses: Normal pulses.     Heart sounds: Normal heart sounds.  Pulmonary:     Effort: Pulmonary effort is normal. No respiratory distress.     Breath sounds: Normal breath sounds and air entry. No stridor, decreased air movement or transmitted upper airway sounds. No decreased breath sounds, wheezing,  rhonchi or rales.  Chest:     Chest wall: Swelling and edema present. No mass, lacerations, deformity, tenderness or crepitus.    Abdominal:     General: Abdomen is flat.     Palpations: Abdomen is soft.  Musculoskeletal: Normal range of motion.        General: Swelling and tenderness present. No deformity or signs of injury.     Right shoulder:  Normal.     Left shoulder: Normal.     Right elbow: Normal.    Left elbow: Normal.     Right hip: Normal.     Left hip: Normal.     Right knee: Normal.     Left knee: Normal.     Cervical back: Normal.     Thoracic back: Normal.     Lumbar back: Normal.     Right upper arm: Normal.     Left upper arm: Normal.     Right hand: Normal.     Left hand: Normal.     Right upper leg: Normal.     Left upper leg: She exhibits tenderness and swelling. She exhibits no bony tenderness, no edema, no deformity and no laceration.     Right lower leg: No edema.     Left lower leg: No edema.       Legs:  Lymphadenopathy:     Head:     Right side of head: No submental, submandibular, tonsillar, preauricular, posterior auricular or occipital adenopathy.     Left side of head: No submental, submandibular, tonsillar, preauricular, posterior auricular or occipital adenopathy.     Cervical: No cervical adenopathy.     Right cervical: No superficial cervical adenopathy.    Left cervical: No superficial cervical adenopathy.     Upper Body:     Right upper body: No axillary adenopathy.     Left upper body: No axillary adenopathy.  Skin:    General: Skin is warm and dry.     Capillary Refill: Capillary refill takes less than 2 seconds.     Coloration: Skin is not ashen, cyanotic, jaundiced, mottled, pale or sallow.     Findings: Erythema and rash present. No abrasion, abscess, acne, bruising, burn, ecchymosis, signs of injury, laceration, lesion, petechiae or wound. Rash is macular, papular and pustular. Rash is not crusting, nodular, purpuric, scaling, urticarial or vesicular.     Nails: There is no clubbing.        Neurological:     General: No focal deficit present.     Mental Status: She is alert and oriented to person, place, and time. Mental status is at baseline.     GCS: GCS eye subscore is 4. GCS verbal subscore is 5. GCS motor subscore is 6.     Cranial Nerves: No cranial nerve deficit,  dysarthria or facial asymmetry.     Sensory: Sensation is intact. No sensory deficit.     Motor: Motor function is intact. No weakness, tremor, atrophy, abnormal muscle tone or seizure activity.     Coordination: Coordination is intact. Coordination normal.     Gait: Gait is intact. Gait normal.     Comments: In/out of chair without difficulty; gait sure and steady; strength equal upper and lower extremities 5/5  Psychiatric:        Attention and Perception: Attention and perception normal.        Mood and Affect: Mood and affect normal.        Speech: Speech normal.  Behavior: Behavior normal. Behavior is cooperative.        Thought Content: Thought content normal.        Cognition and Memory: Cognition and memory normal.        Judgment: Judgment normal.      0.5cm diameter pus collection under skin central induration tender left axilla central dry Left proximal hamstring/distal buttock papule nontender no erythema dry     Assessment & Plan:  A-hidradenitis axilla recurrent  P-Doxycycline 100mg  po BID x 10 days #20 RF0 dispensed from [facility] to patient and diflucan 150mg  po x 1 onset symptoms and may repeat once in 72 hours #2 RF0 electronically to her pharmacy of choice. Patient has history of vulvovaginal yeast infection with doxycycline administration. Exitcare handout on hidradenitis supportive.  Follow up with PCM if she feels needs I&D due to no lidocaine in our clinic cannot do deep I&D no pain control/numbing medication available-monitor for fever, red streaks, prolonged pus buildup after 48 hours of antibiotics or worsening pain/swelling seek re-evaluation.  Continue washing with her scrub and applying clindamycin topical.  Patient verbalized understanding information/instructions, agreed with plan of care and had no further questions at this time.

## 2018-10-02 ENCOUNTER — Other Ambulatory Visit: Payer: Self-pay | Admitting: Registered Nurse

## 2018-10-17 ENCOUNTER — Other Ambulatory Visit: Payer: Self-pay | Admitting: Family Medicine

## 2018-10-17 DIAGNOSIS — R591 Generalized enlarged lymph nodes: Secondary | ICD-10-CM

## 2018-11-29 ENCOUNTER — Encounter: Payer: Self-pay | Admitting: Registered Nurse

## 2018-11-29 ENCOUNTER — Other Ambulatory Visit: Payer: Self-pay

## 2018-11-29 ENCOUNTER — Ambulatory Visit: Payer: Self-pay | Admitting: Registered Nurse

## 2018-11-29 VITALS — BP 118/79 | HR 79 | Temp 98.3°F

## 2018-11-29 DIAGNOSIS — H6981 Other specified disorders of Eustachian tube, right ear: Secondary | ICD-10-CM

## 2018-11-29 DIAGNOSIS — J301 Allergic rhinitis due to pollen: Secondary | ICD-10-CM

## 2018-11-29 DIAGNOSIS — J019 Acute sinusitis, unspecified: Secondary | ICD-10-CM

## 2018-11-29 DIAGNOSIS — H6692 Otitis media, unspecified, left ear: Secondary | ICD-10-CM

## 2018-11-29 MED ORDER — FLUCONAZOLE 150 MG PO TABS: ORAL_TABLET | ORAL | 0 refills | Status: DC

## 2018-11-29 MED ORDER — AMOXICILLIN-POT CLAVULANATE 875-125 MG PO TABS: 1.0000 | ORAL_TABLET | Freq: Two times a day (BID) | ORAL | 0 refills | Status: AC

## 2018-11-29 MED ORDER — SALINE SPRAY 0.65 % NA SOLN: 2.0000 | NASAL | 0 refills | Status: DC

## 2018-11-29 MED ORDER — PHENYLEPHRINE HCL 5 MG PO TABS: 5.0000 mg | ORAL_TABLET | Freq: Four times a day (QID) | ORAL | Status: AC | PRN

## 2018-11-29 NOTE — Progress Notes (Signed)
Subjective:    Patient ID: Sydney Spencer, female    DOB: [birth year], 43 y.o.   MRN: [id number]  43y/o Caucasian single established female pt c/o sinus pain and pressure x3 days. Has been taking nasal saline, Claritin and Mucinex at home with some relief of sinus symptoms feeling a little better today but worried she is getting ear infection. Frontal sinus pressure has resolved. PND still present. Denies ear drainage, muffled sounds, sore throat, cough, chest congestion. Sts she often gets ear infections r/t sinusitis and would like to ensure ears are clear.   Right ear slightly uncomfortable.  Last antibiotics Jun 2020 Doxycycline and Jan 2020 Augmentin  Typically gets vaginal yeast infection with antibiotics diflucan works well for her  Works in facility [employer]     Review of Systems  Constitutional: Negative for activity change, appetite change, chills, diaphoresis, fatigue and fever.  HENT: Positive for ear pain, hearing loss, postnasal drip, sinus pressure and sinus pain. Negative for dental problem, drooling, ear discharge, facial swelling, mouth sores, nosebleeds, rhinorrhea, sneezing, tinnitus, trouble swallowing and voice change.   Eyes: Negative for photophobia, pain, discharge, redness, itching and visual disturbance.  Respiratory: Negative for cough, shortness of breath, wheezing and stridor.   Cardiovascular: Negative for chest pain.  Gastrointestinal: Negative for abdominal pain, constipation, diarrhea, nausea and vomiting.  Endocrine: Negative for cold intolerance and heat intolerance.  Genitourinary: Negative for difficulty urinating.  Musculoskeletal: Negative for myalgias, neck pain and neck stiffness.  Skin: Negative for color change, pallor, rash and wound.  Allergic/Immunologic: Positive for environmental allergies. Negative for food allergies.  Neurological: Negative for dizziness, tremors, seizures, syncope, facial asymmetry, speech difficulty,  weakness, light-headedness and numbness.  Hematological: Negative for adenopathy. Does not bruise/bleed easily.  Psychiatric/Behavioral: Negative for agitation, confusion and sleep disturbance.       Objective:   Physical Exam Vitals signs and nursing note reviewed.  Constitutional:      General: She is awake. She is not in acute distress.    Appearance: Normal appearance. She is well-developed, well-groomed and normal weight. She is not ill-appearing, toxic-appearing or diaphoretic.  HENT:     Head: Normocephalic and atraumatic. No raccoon eyes, abrasion, right periorbital erythema or left periorbital erythema.     Jaw: There is normal jaw occlusion. No trismus, tenderness, swelling, pain on movement or malocclusion.     Salivary Glands: Right salivary gland is not diffusely enlarged or tender. Left salivary gland is not diffusely enlarged or tender.     Right Ear: Hearing, ear canal and external ear normal. No decreased hearing noted. Tenderness present. No drainage or swelling. A middle ear effusion is present. There is no impacted cerumen. No mastoid tenderness. No PE tube. No hemotympanum. Tympanic membrane is scarred. Tympanic membrane is not injected, perforated, erythematous or retracted.     Left Ear: Hearing, ear canal and external ear normal. No decreased hearing noted. No drainage, swelling or tenderness. A middle ear effusion is present. There is no impacted cerumen. No mastoid tenderness. No PE tube. No hemotympanum. Tympanic membrane is injected, scarred and erythematous. Tympanic membrane is not perforated or retracted.     Ears:      Comments: Left TM erythematous and vasculature injection: air fluid level clear and scarring noted bilaterally; bilateral allergic shiners; lower eyelids nonpitting edema 1+/4; cobblestoning posterior pharynx and tonsils 1-2+/4 bilaterally without exudate    Nose: Mucosal edema, congestion and rhinorrhea present. No nasal deformity, septal  deviation, signs of injury or  laceration.     Right Turbinates: Not enlarged, swollen or pale.     Left Turbinates: Not enlarged, swollen or pale.     Right Sinus: Maxillary sinus tenderness and frontal sinus tenderness present.     Left Sinus: Maxillary sinus tenderness and frontal sinus tenderness present.     Mouth/Throat:     Lips: Pink. No lesions.     Mouth: Mucous membranes are moist. Mucous membranes are not pale, not dry and not cyanotic. No injury, lacerations, oral lesions or angioedema.     Dentition: Normal dentition. Does not have dentures. No gingival swelling, dental caries, dental abscesses or gum lesions.     Tongue: No lesions. Tongue does not deviate from midline.     Palate: No mass and lesions.     Pharynx: Uvula midline. Pharyngeal swelling and posterior oropharyngeal erythema present. No oropharyngeal exudate or uvula swelling.     Tonsils: No tonsillar exudate or tonsillar abscesses. 1+ on the right. 1+ on the left.  Eyes:     General: Lids are normal. Vision grossly intact. Gaze aligned appropriately. Allergic shiner present. No visual field deficit or scleral icterus.       Right eye: No foreign body, discharge or hordeolum.        Left eye: No foreign body, discharge or hordeolum.     Extraocular Movements: Extraocular movements intact.     Right eye: Normal extraocular motion and no nystagmus.     Left eye: Normal extraocular motion and no nystagmus.     Conjunctiva/sclera: Conjunctivae normal.     Right eye: Right conjunctiva is not injected. No chemosis, exudate or hemorrhage.    Left eye: Left conjunctiva is not injected. No chemosis, exudate or hemorrhage.    Pupils: Pupils are equal, round, and reactive to light. Pupils are equal.     Right eye: Pupil is round and reactive.     Left eye: Pupil is round and reactive.  Neck:     Musculoskeletal: Normal range of motion and neck supple. Normal range of motion. No edema, erythema, neck rigidity, crepitus,  injury, pain with movement, torticollis, spinous process tenderness or muscular tenderness.     Thyroid: No thyroid mass, thyromegaly or thyroid tenderness.     Trachea: Trachea and phonation normal. No tracheal tenderness, tracheostomy, abnormal tracheal secretions or tracheal deviation.  Cardiovascular:     Rate and Rhythm: Normal rate and regular rhythm.     Chest Wall: PMI is not displaced.     Pulses: Normal pulses.          Radial pulses are 2+ on the right side and 2+ on the left side.     Heart sounds: Normal heart sounds, S1 normal and S2 normal. No murmur. No friction rub. No gallop.   Pulmonary:     Effort: Pulmonary effort is normal. No accessory muscle usage or respiratory distress.     Breath sounds: Normal breath sounds and air entry. No stridor, decreased air movement or transmitted upper airway sounds. No decreased breath sounds, wheezing, rhonchi or rales.     Comments: Wearing cloth mask due to covid 19 pandemic; no cough observed in exam room; some throat and nose clearing noted in exam room; spoke full sentences without difficulty Chest:     Chest wall: No tenderness.  Abdominal:     General: There is no distension.     Palpations: Abdomen is soft.  Musculoskeletal: Normal range of motion.  General: No swelling, tenderness or deformity.     Right shoulder: Normal.     Left shoulder: Normal.     Right elbow: Normal.    Left elbow: Normal.     Right hip: Normal.     Left hip: Normal.     Right knee: Normal.     Left knee: Normal.     Cervical back: Normal.     Thoracic back: Normal.     Lumbar back: Normal.     Right hand: Normal.     Left hand: Normal.     Right lower leg: No edema.     Left lower leg: No edema.  Lymphadenopathy:     Head:     Right side of head: No submental, submandibular, tonsillar, preauricular, posterior auricular or occipital adenopathy.     Left side of head: No submental, submandibular, tonsillar, preauricular, posterior  auricular or occipital adenopathy.     Cervical: No cervical adenopathy.     Right cervical: No superficial, deep or posterior cervical adenopathy.    Left cervical: No superficial, deep or posterior cervical adenopathy.  Skin:    General: Skin is warm and dry.     Capillary Refill: Capillary refill takes less than 2 seconds.     Coloration: Skin is not ashen, cyanotic, jaundiced, mottled, pale or sallow.     Findings: No abrasion, abscess, acne, bruising, burn, ecchymosis, erythema, signs of injury, laceration, lesion, petechiae or rash.     Nails: There is no clubbing.   Neurological:     General: No focal deficit present.     Mental Status: She is alert and oriented to person, place, and time. Mental status is at baseline. She is not disoriented.     GCS: GCS eye subscore is 4. GCS verbal subscore is 5. GCS motor subscore is 6.     Cranial Nerves: Cranial nerves are intact. No cranial nerve deficit, dysarthria or facial asymmetry.     Sensory: Sensation is intact. No sensory deficit.     Motor: Motor function is intact. No weakness, tremor, atrophy, abnormal muscle tone or seizure activity.     Coordination: Coordination is intact. Coordination normal.     Gait: Gait is intact. Gait normal.     Comments: Gait sure and steady in clinic; on/off exam table and in/out of chair without difficulty; bilateral hand grasp equal upper and lower extremities 5/5 strength equal  Psychiatric:        Attention and Perception: Attention and perception normal.        Mood and Affect: Mood and affect normal.        Speech: Speech normal.        Behavior: Behavior normal. Behavior is cooperative.        Thought Content: Thought content normal.        Cognition and Memory: Cognition and memory normal.        Judgment: Judgment normal.           Assessment & Plan:  A-acute rhinosinusitis, seasonal allergic rhinitis, eustachian tube dysfunction right and acute otitis media with effusion  left  P-Supportive treatment. Augmentin  po BID x 10 days #20 RF0 dispensed from [facility] to patient  Electronic Rx diflucan  po x1 and may repeat in 72 hours #2 RF0 to her pharmacy of choice as history vaginal yeast with oral antibiotic use.  Tylenol  po QID prn pain/fever.   No evidence of invasive bacterial infection, non toxic and well  hydrated.  This is most likely self limiting viral infection.  I do not see where any further testing or imaging is necessary at this time.   I will suggest supportive care, rest, good hygiene and encourage the patient to take adequate fluids.  The patient is to return to clinic or EMERGENCY ROOM if symptoms worsen or change significantly e.g. ear pain, fever, purulent discharge from ears or bleeding.  Exitcare handout on otitis media   Patient verbalized agreement and understanding of treatment plan.    continue flonase 1 spray each nostril BID, saline 2 sprays each nostril q2h wa prn congestion.  Patient is starting augmentin 875mg  po BID x 10 days today due to otitis media will also cover for bacterial sinusitis but not viral/allergic.  Continue allergy medications to control those symptoms and start phenylephrine 5-10mg  po QID prn rhinitis given 4 UD from clinic stock. Discussed post nasal drip causing throat swelling eustachian tubes blocked feeling pressure of fluid buildup right middle ear will take 30 days for it to clear fluid once post nasal drip controlled.  Discussed washing mask every day/changing.  Stay inside due to ragweed pollen high and she is sensitive.  Consider shower BID am/hs.   Has tylenol at home for pain relief did not want from clinic stock today.  Denied personal or family history of ENT cancer.  Shower BID especially prior to bed. No evidence of systemic bacterial infection, non toxic and well hydrated.  I do not see where any further testing or imaging is necessary at this time.   I will suggest supportive care, rest, good hygiene and  encourage the patient to take adequate fluids.  The patient is to return to clinic or EMERGENCY ROOM if symptoms worsen or change significantly.  Exitcare handout on sinusitis and sinus rinse.  Patient verbalized agreement and understanding of treatment plan and had no further questions at this time.    Patient may use normal saline nasal spray 2 sprays each nostril q2h wa as needed.  Increase frequency of nasal saline use received 1 bottle from clinic stock today.   flonase 46mcg 1 spray each nostril BID at home.  Patient denied personal or family history of ENT cancer.  OTC antihistamine of choice claritin 10mg  po daily.  Avoid triggers if possible.  Shower prior to bedtime if exposed to triggers.  If allergic dust/dust mites recommend mattress/pillow covers/encasements; washing linens, vacuuming, sweeping, dusting weekly.  Call or return to clinic as needed if these symptoms worsen or fail to improve as anticipated.   Exitcare handout on allergic rhinitis and sinus rinse given to patient.  Patient verbalized understanding of instructions, agreed with plan of care and had no further questions at this time.  P2:  Avoidance and hand washing. P2:  Hand washing and cover cough

## 2018-11-29 NOTE — Patient Instructions (Signed)
Eustachian Tube Dysfunction  Eustachian tube dysfunction refers to a condition in which a blockage develops in the narrow passage that connects the middle ear to the back of the nose (eustachian tube). The eustachian tube regulates air pressure in the middle ear by letting air move between the ear and nose. It also helps to drain fluid from the middle ear space. Eustachian tube dysfunction can affect one or both ears. When the eustachian tube does not function properly, air pressure, fluid, or both can build up in the middle ear. What are the causes? This condition occurs when the eustachian tube becomes blocked or cannot open normally. Common causes of this condition include:  Ear infections.  Colds and other infections that affect the nose, mouth, and throat (upper respiratory tract).  Allergies.  Irritation from cigarette smoke.  Irritation from stomach acid coming up into the esophagus (gastroesophageal reflux). The esophagus is the tube that carries food from the mouth to the stomach.  Sudden changes in air pressure, such as from descending in an airplane or scuba diving.  Abnormal growths in the nose or throat, such as: ? Growths that line the nose (nasal polyps). ? Abnormal growth of cells (tumors). ? Enlarged tissue at the back of the throat (adenoids). What increases the risk? You are more likely to develop this condition if:  You smoke.  You are overweight.  You are a child who has: ? Certain birth defects of the mouth, such as cleft palate. ? Large tonsils or adenoids. What are the signs or symptoms? Common symptoms of this condition include:  A feeling of fullness in the ear.  Ear pain.  Clicking or popping noises in the ear.  Ringing in the ear.  Hearing loss.  Loss of balance.  Dizziness. Symptoms may get worse when the air pressure around you changes, such as when you travel to an area of high elevation, fly on an airplane, or go scuba diving. How is  this diagnosed? This condition may be diagnosed based on:  Your symptoms.  A physical exam of your ears, nose, and throat.  Tests, such as those that measure: ? The movement of your eardrum (tympanogram). ? Your hearing (audiometry). How is this treated? Treatment depends on the cause and severity of your condition.  In mild cases, you may relieve your symptoms by moving air into your ears. This is called "popping the ears."  In more severe cases, or if you have symptoms of fluid in your ears, treatment may include: ? Medicines to relieve congestion (decongestants). ? Medicines that treat allergies (antihistamines). ? Nasal sprays or ear drops that contain medicines that reduce swelling (steroids). ? A procedure to drain the fluid in your eardrum (myringotomy). In this procedure, a small tube is placed in the eardrum to:  Drain the fluid.  Restore the air in the middle ear space. ? A procedure to insert a balloon device through the nose to inflate the opening of the eustachian tube (balloon dilation). Follow these instructions at home: Lifestyle  Do not do any of the following until your health care provider approves: ? Travel to high altitudes. ? Fly in airplanes. ? Work in a pressurized cabin or room. ? Scuba dive.  Do not use any products that contain nicotine or tobacco, such as cigarettes and e-cigarettes. If you need help quitting, ask your health care provider.  Keep your ears dry. Wear fitted earplugs during showering and bathing. Dry your ears completely after. General instructions  Take over-the-counter   and prescription medicines only as told by your health care provider.  Use techniques to help pop your ears as recommended by your health care provider. These may include: ? Chewing gum. ? Yawning. ? Frequent, forceful swallowing. ? Closing your mouth, holding your nose closed, and gently blowing as if you are trying to blow air out of your nose.  Keep all  follow-up visits as told by your health care provider. This is important. Contact a health care provider if:  Your symptoms do not go away after treatment.  Your symptoms come back after treatment.  You are unable to pop your ears.  You have: ? A fever. ? Pain in your ear. ? Pain in your head or neck. ? Fluid draining from your ear.  Your hearing suddenly changes.  You become very dizzy.  You lose your balance. Summary  Eustachian tube dysfunction refers to a condition in which a blockage develops in the eustachian tube.  It can be caused by ear infections, allergies, inhaled irritants, or abnormal growths in the nose or throat.  Symptoms include ear pain, hearing loss, or ringing in the ears.  Mild cases are treated with maneuvers to unblock the ears, such as yawning or ear popping.  Severe cases are treated with medicines. Surgery may also be done (rare). This information is not intended to replace advice given to you by your health care provider. Make sure you discuss any questions you have with your health care provider. Document Released: 03/20/2015 Document Revised: 06/13/2017 Document Reviewed: 06/13/2017 Elsevier Patient Education  [employer]. Otitis Media, Adult  Otitis media occurs when there is inflammation and fluid in the middle ear. Your middle ear is a part of the ear that contains bones for hearing as well as air that helps send sounds to your brain. What are the causes? This condition is caused by a blockage in the eustachian tube. This tube drains fluid from the ear to the back of the nose (nasopharynx). A blockage in this tube can be caused by an object or by swelling (edema) in the tube. Problems that can cause a blockage include:  A cold or other upper respiratory infection.  Allergies.  An irritant, such as tobacco smoke.  Enlarged adenoids. The adenoids are areas of soft tissue located high in the back of the throat, behind the nose and the  roof of the mouth.  A mass in the nasopharynx.  Damage to the ear caused by pressure changes (barotrauma). What are the signs or symptoms? Symptoms of this condition include:  Ear pain.  A fever.  Decreased hearing.  A headache.  Tiredness (lethargy).  Fluid leaking from the ear.  Ringing in the ear. How is this diagnosed? This condition is diagnosed with a physical exam. During the exam your health care provider will use an instrument called an otoscope to look into your ear and check for redness, swelling, and fluid. He or she will also ask about your symptoms. Your health care provider may also order tests, such as:  A test to check the movement of the eardrum (pneumatic otoscopy). This test is done by squeezing a small amount of air into the ear.  A test that changes air pressure in the middle ear to check how well the eardrum moves and whether the eustachian tube is working (tympanogram). How is this treated? This condition usually goes away on its own within 3-5 days. But if the condition is caused by a bacteria infection and does  not go away own its own, or keeps coming back, your health care provider may:  Prescribe antibiotic medicines to treat the infection.  Prescribe or recommend medicines to control pain. Follow these instructions at home:  Take over-the-counter and prescription medicines only as told by your health care provider.  If you were prescribed an antibiotic medicine, take it as told by your health care provider. Do not stop taking the antibiotic even if you start to feel better.  Keep all follow-up visits as told by your health care provider. This is important. Contact a health care provider if:  You have bleeding from your nose.  There is a lump on your neck.  You are not getting better in 5 days.  You feel worse instead of better. Get help right away if:  You have severe pain that is not controlled with medicine.  You have swelling,  redness, or pain around your ear.  You have stiffness in your neck.  A part of your face is paralyzed.  The bone behind your ear (mastoid) is tender when you touch it.  You develop a severe headache. Summary  Otitis media is redness, soreness, and swelling of the middle ear.  This condition usually goes away on its own within 3-5 days.  If the problem does not go away in 3-5 days, your health care provider may prescribe or recommend medicines to treat your symptoms.  If you were prescribed an antibiotic medicine, take it as told by your health care provider. This information is not intended to replace advice given to you by your health care provider. Make sure you discuss any questions you have with your health care provider. Document Released: 11/27/2003 Document Revised: 02/03/2017 Document Reviewed: 02/12/2016 Elsevier Patient Education  2020 [employer]. Sinusitis, Adult Sinusitis is inflammation of your sinuses. Sinuses are hollow spaces in the bones around your face. Your sinuses are located:  Around your eyes.  In the middle of your forehead.  Behind your nose.  In your cheekbones. Mucus normally drains out of your sinuses. When your nasal tissues become inflamed or swollen, mucus can become trapped or blocked. This allows bacteria, viruses, and fungi to grow, which leads to infection. Most infections of the sinuses are caused by a virus. Sinusitis can develop quickly. It can last for up to 4 weeks (acute) or for more than 12 weeks (chronic). Sinusitis often develops after a cold. What are the causes? This condition is caused by anything that creates swelling in the sinuses or stops mucus from draining. This includes:  Allergies.  Asthma.  Infection from bacteria or viruses.  Deformities or blockages in your nose or sinuses.  Abnormal growths in the nose (nasal polyps).  Pollutants, such as chemicals or irritants in the air.  Infection from fungi (rare). What  increases the risk? You are more likely to develop this condition if you:  Have a weak body defense system (immune system).  Do a lot of swimming or diving.  Overuse nasal sprays.  Smoke. What are the signs or symptoms? The main symptoms of this condition are pain and a feeling of pressure around the affected sinuses. Other symptoms include:  Stuffy nose or congestion.  Thick drainage from your nose.  Swelling and warmth over the affected sinuses.  Headache.  Upper toothache.  A cough that may get worse at night.  Extra mucus that collects in the throat or the back of the nose (postnasal drip).  Decreased sense of smell and taste.  Fatigue.  A fever.  Sore throat.  Bad breath. How is this diagnosed? This condition is diagnosed based on:  Your symptoms.  Your medical history.  A physical exam.  Tests to find out if your condition is acute or chronic. This may include: ? Checking your nose for nasal polyps. ? Viewing your sinuses using a device that has a light (endoscope). ? Testing for allergies or bacteria. ? Imaging tests, such as an MRI or CT scan. In rare cases, a bone biopsy may be done to rule out more serious types of fungal sinus disease. How is this treated? Treatment for sinusitis depends on the cause and whether your condition is chronic or acute.  If caused by a virus, your symptoms should go away on their own within 10 days. You may be given medicines to relieve symptoms. They include: ? Medicines that shrink swollen nasal passages (topical intranasal decongestants). ? Medicines that treat allergies (antihistamines). ? A spray that eases inflammation of the nostrils (topical intranasal corticosteroids). ? Rinses that help get rid of thick mucus in your nose (nasal saline washes).  If caused by bacteria, your health care provider may recommend waiting to see if your symptoms improve. Most bacterial infections will get better without antibiotic  medicine. You may be given antibiotics if you have: ? A severe infection. ? A weak immune system.  If caused by narrow nasal passages or nasal polyps, you may need to have surgery. Follow these instructions at home: Medicines  Take, use, or apply over-the-counter and prescription medicines only as told by your health care provider. These may include nasal sprays.  If you were prescribed an antibiotic medicine, take it as told by your health care provider. Do not stop taking the antibiotic even if you start to feel better. Hydrate and humidify   Drink enough fluid to keep your urine pale yellow. Staying hydrated will help to thin your mucus.  Use a cool mist humidifier to keep the humidity level in your home above 50%.  Inhale steam for 10-15 minutes, 3-4 times a day, or as told by your health care provider. You can do this in the bathroom while a hot shower is running.  Limit your exposure to cool or dry air. Rest  Rest as much as possible.  Sleep with your head raised (elevated).  Make sure you get enough sleep each night. General instructions   Apply a warm, moist washcloth to your face 3-4 times a day or as told by your health care provider. This will help with discomfort.  Wash your hands often with soap and water to reduce your exposure to germs. If soap and water are not available, use hand sanitizer.  Do not smoke. Avoid being around people who are smoking (secondhand smoke).  Keep all follow-up visits as told by your health care provider. This is important. Contact a health care provider if:  You have a fever.  Your symptoms get worse.  Your symptoms do not improve within 10 days. Get help right away if:  You have a severe headache.  You have persistent vomiting.  You have severe pain or swelling around your face or eyes.  You have vision problems.  You develop confusion.  Your neck is stiff.  You have trouble breathing. Summary  Sinusitis is  soreness and inflammation of your sinuses. Sinuses are hollow spaces in the bones around your face.  This condition is caused by nasal tissues that become inflamed or swollen. The swelling traps  or blocks the flow of mucus. This allows bacteria, viruses, and fungi to grow, which leads to infection.  If you were prescribed an antibiotic medicine, take it as told by your health care provider. Do not stop taking the antibiotic even if you start to feel better.  Keep all follow-up visits as told by your health care provider. This is important. This information is not intended to replace advice given to you by your health care provider. Make sure you discuss any questions you have with your health care provider. Document Released: 02/21/2005 Document Revised: 07/24/2017 Document Reviewed: 07/24/2017 Elsevier Patient Education  2020 [employer]. Allergic Rhinitis, Adult Allergic rhinitis is an allergic reaction that affects the mucous membrane inside the nose. It causes sneezing, a runny or stuffy nose, and the feeling of mucus going down the back of the throat (postnasal drip). Allergic rhinitis can be mild to severe. There are two types of allergic rhinitis:  Seasonal. This type is also called hay fever. It happens only during certain seasons.  Perennial. This type can happen at any time of the year. What are the causes? This condition happens when the body's defense system (immune system) responds to certain harmless substances called allergens as though they were germs.  Seasonal allergic rhinitis is triggered by pollen, which can come from grasses, trees, and weeds. Perennial allergic rhinitis may be caused by:  House dust mites.  Pet dander.  Mold spores. What are the signs or symptoms? Symptoms of this condition include:  Sneezing.  Runny or stuffy nose (nasal congestion).  Postnasal drip.  Itchy nose.  Tearing of the eyes.  Trouble sleeping.  Daytime sleepiness. How is  this diagnosed? This condition may be diagnosed based on:  Your medical history.  A physical exam.  Tests to check for related conditions, such as: ? Asthma. ? Pink eye. ? Ear infection. ? Upper respiratory infection.  Tests to find out which allergens trigger your symptoms. These may include skin or blood tests. How is this treated? There is no cure for this condition, but treatment can help control symptoms. Treatment may include:  Taking medicines that block allergy symptoms, such as antihistamines. Medicine may be given as a shot, nasal spray, or pill.  Avoiding the allergen.  Desensitization. This treatment involves getting ongoing shots until your body becomes less sensitive to the allergen. This treatment may be done if other treatments do not help.  If taking medicine and avoiding the allergen does not work, new, stronger medicines may be prescribed. Follow these instructions at home:  Find out what you are allergic to. Common allergens include smoke, dust, and pollen.  Avoid the things you are allergic to. These are some things you can do to help avoid allergens: ? Replace carpet with wood, tile, or vinyl flooring. Carpet can trap dander and dust. ? Do not smoke. Do not allow smoking in your home. ? Change your heating and air conditioning filter at least once a month. ? During allergy season:  Keep windows closed as much as possible.  Plan outdoor activities when pollen counts are lowest. This is usually during the evening hours.  When coming indoors, change clothing and shower before sitting on furniture or bedding.  Take over-the-counter and prescription medicines only as told by your health care provider.  Keep all follow-up visits as told by your health care provider. This is important. Contact a health care provider if:  You have a fever.  You develop a persistent cough.  You  make whistling sounds when you breathe (you wheeze).  Your symptoms  interfere with your normal daily activities. Get help right away if:  You have shortness of breath. Summary  This condition can be managed by taking medicines as directed and avoiding allergens.  Contact your health care provider if you develop a persistent cough or fever.  During allergy season, keep windows closed as much as possible. This information is not intended to replace advice given to you by your health care provider. Make sure you discuss any questions you have with your health care provider. Document Released: 11/16/2000 Document Revised: 02/03/2017 Document Reviewed: 03/31/2016 Elsevier Patient Education  2020 [employer]. How to Perform a Sinus Rinse A sinus rinse is a home treatment that is used to rinse your sinuses with a sterile mixture of salt and water (saline solution). Sinuses are air-filled spaces in your skull behind the bones of your face and forehead that open into your nasal cavity. A sinus rinse can help to clear mucus, dirt, dust, or pollen from your nasal cavity. You may do a sinus rinse when you have a cold, a virus, nasal allergy symptoms, a sinus infection, or stuffiness in your nose or sinuses. Talk with your health care provider about whether a sinus rinse might help you. What are the risks? A sinus rinse is generally safe and effective. However, there are a few risks, which include:  A burning sensation in your sinuses. This may happen if you do not make the saline solution as directed. Be sure to follow all directions when making the saline solution.  Nasal irritation.  Infection from contaminated water. This is rare, but possible. Do not do a sinus rinse if you have had ear or nasal surgery, ear infection, or blocked ears. Supplies needed:  Saline solution or powder.  Distilled or sterile water may be needed to mix with saline powder. ? You may use boiled and cooled tap water. Boil tap water for 5 minutes; cool until it is lukewarm. Use within  24 hours. ? Do not use regular tap water to mix with the saline solution.  Neti pot or nasal rinse bottle. These supplies release the saline solution into your nose and through your sinuses. Neti pots and nasal rinse bottles can be purchased at [employer], a health food store, or online. How to perform a sinus rinse  1. Wash your hands with soap and water. 2. Wash your device according to the directions that came with the product and then dry it. 3. Use the solution that comes with your product or one that is sold separately in stores. Follow the mixing directions on the package if you need to mix with sterile or distilled water. 4. Fill the device with the amount of saline solution noted in the device instructions. 5. Stand over a sink and tilt your head sideways over the sink. 6. Place the spout of the device in your upper nostril (the one closer to the ceiling). 7. Gently pour or squeeze the saline solution into your nasal cavity. The liquid should drain out from the lower nostril if you are not too congested. 8. While rinsing, breathe through your open mouth. 9. Gently blow your nose to clear any mucus and rinse solution. Blowing too hard may cause ear pain. 10. Repeat in your other nostril. 11. Clean and rinse your device with clean water and then air-dry it. Talk with your health care provider or pharmacist if you have questions about how to  do a sinus rinse. Summary  A sinus rinse is a home treatment that is used to rinse your sinuses with a sterile mixture of salt and water (saline solution).  A sinus rinse is generally safe and effective. Follow all instructions carefully.  Before doing a sinus rinse, talk with your health care provider about whether it would be helpful for you. This information is not intended to replace advice given to you by your health care provider. Make sure you discuss any questions you have with your health care provider. Document Released:  09/18/2013 Document Revised: 12/19/2016 Document Reviewed: 12/19/2016 Elsevier Patient Education  2020 [employer] Sinusitis, Adult Sinusitis is inflammation of your sinuses. Sinuses are hollow spaces in the bones around your face. Your sinuses are located:  Around your eyes.  In the middle of your forehead.  Behind your nose.  In your cheekbones. Mucus normally drains out of your sinuses. When your nasal tissues become inflamed or swollen, mucus can become trapped or blocked. This allows bacteria, viruses, and fungi to grow, which leads to infection. Most infections of the sinuses are caused by a virus. Sinusitis can develop quickly. It can last for up to 4 weeks (acute) or for more than 12 weeks (chronic). Sinusitis often develops after a cold. What are the causes? This condition is caused by anything that creates swelling in the sinuses or stops mucus from draining. This includes:  Allergies.  Asthma.  Infection from bacteria or viruses.  Deformities or blockages in your nose or sinuses.  Abnormal growths in the nose (nasal polyps).  Pollutants, such as chemicals or irritants in the air.  Infection from fungi (rare). What increases the risk? You are more likely to develop this condition if you:  Have a weak body defense system (immune system).  Do a lot of swimming or diving.  Overuse nasal sprays.  Smoke. What are the signs or symptoms? The main symptoms of this condition are pain and a feeling of pressure around the affected sinuses. Other symptoms include:  Stuffy nose or congestion.  Thick drainage from your nose.  Swelling and warmth over the affected sinuses.  Headache.  Upper toothache.  A cough that may get worse at night.  Extra mucus that collects in the throat or the back of the nose (postnasal drip).  Decreased sense of smell and taste.  Fatigue.  A fever.  Sore throat.  Bad breath. How is this diagnosed? This condition is diagnosed  based on:  Your symptoms.  Your medical history.  A physical exam.  Tests to find out if your condition is acute or chronic. This may include: ? Checking your nose for nasal polyps. ? Viewing your sinuses using a device that has a light (endoscope). ? Testing for allergies or bacteria. ? Imaging tests, such as an MRI or CT scan. In rare cases, a bone biopsy may be done to rule out more serious types of fungal sinus disease. How is this treated? Treatment for sinusitis depends on the cause and whether your condition is chronic or acute.  If caused by a virus, your symptoms should go away on their own within 10 days. You may be given medicines to relieve symptoms. They include: ? Medicines that shrink swollen nasal passages (topical intranasal decongestants). ? Medicines that treat allergies (antihistamines). ? A spray that eases inflammation of the nostrils (topical intranasal corticosteroids). ? Rinses that help get rid of thick mucus in your nose (nasal saline washes).  If caused by bacteria, your  health care provider may recommend waiting to see if your symptoms improve. Most bacterial infections will get better without antibiotic medicine. You may be given antibiotics if you have: ? A severe infection. ? A weak immune system.  If caused by narrow nasal passages or nasal polyps, you may need to have surgery. Follow these instructions at home: Medicines  Take, use, or apply over-the-counter and prescription medicines only as told by your health care provider. These may include nasal sprays.  If you were prescribed an antibiotic medicine, take it as told by your health care provider. Do not stop taking the antibiotic even if you start to feel better. Hydrate and humidify   Drink enough fluid to keep your urine pale yellow. Staying hydrated will help to thin your mucus.  Use a cool mist humidifier to keep the humidity level in your home above 50%.  Inhale steam for 10-15  minutes, 3-4 times a day, or as told by your health care provider. You can do this in the bathroom while a hot shower is running.  Limit your exposure to cool or dry air. Rest  Rest as much as possible.  Sleep with your head raised (elevated).  Make sure you get enough sleep each night. General instructions   Apply a warm, moist washcloth to your face 3-4 times a day or as told by your health care provider. This will help with discomfort.  Wash your hands often with soap and water to reduce your exposure to germs. If soap and water are not available, use hand sanitizer.  Do not smoke. Avoid being around people who are smoking (secondhand smoke).  Keep all follow-up visits as told by your health care provider. This is important. Contact a health care provider if:  You have a fever.  Your symptoms get worse.  Your symptoms do not improve within 10 days. Get help right away if:  You have a severe headache.  You have persistent vomiting.  You have severe pain or swelling around your face or eyes.  You have vision problems.  You develop confusion.  Your neck is stiff.  You have trouble breathing. Summary  Sinusitis is soreness and inflammation of your sinuses. Sinuses are hollow spaces in the bones around your face.  This condition is caused by nasal tissues that become inflamed or swollen. The swelling traps or blocks the flow of mucus. This allows bacteria, viruses, and fungi to grow, which leads to infection.  If you were prescribed an antibiotic medicine, take it as told by your health care provider. Do not stop taking the antibiotic even if you start to feel better.  Keep all follow-up visits as told by your health care provider. This is important. This information is not intended to replace advice given to you by your health care provider. Make sure you discuss any questions you have with your health care provider. Document Released: 02/21/2005 Document Revised:  07/24/2017 Document Reviewed: 07/24/2017 Elsevier Patient Education  2020 [employer].

## 2019-01-29 ENCOUNTER — Ambulatory Visit: Payer: Self-pay | Admitting: Registered Nurse

## 2019-01-29 ENCOUNTER — Encounter: Payer: Self-pay | Admitting: Registered Nurse

## 2019-01-29 ENCOUNTER — Other Ambulatory Visit: Payer: Self-pay

## 2019-01-29 VITALS — BP 125/88 | HR 82 | Temp 98.0°F | Resp 16

## 2019-01-29 DIAGNOSIS — L03317 Cellulitis of buttock: Secondary | ICD-10-CM

## 2019-01-29 MED ORDER — AMOXICILLIN-POT CLAVULANATE 875-125 MG PO TABS: 1.0000 | ORAL_TABLET | Freq: Two times a day (BID) | ORAL | 0 refills | Status: AC

## 2019-01-29 MED ORDER — FLUTICASONE PROPIONATE 50 MCG/ACT NA SUSP: 1.0000 | Freq: Two times a day (BID) | NASAL | 2 refills | Status: DC

## 2019-01-29 MED ORDER — SALINE SPRAY 0.65 % NA SOLN: 2.0000 | NASAL | 0 refills | Status: DC

## 2019-01-29 MED ORDER — BACITRACIN-NEOMYCIN-POLYMYXIN 400-5-5000 EX OINT: 1.0000 "application " | TOPICAL_OINTMENT | Freq: Two times a day (BID) | CUTANEOUS | 0 refills | Status: DC

## 2019-01-29 NOTE — Progress Notes (Signed)
Subjective:    Patient ID: Sydney Spencer, female    DOB: 12/29/1975, 43 y.o.   MRN: [id number]  43y/o single caucasian female established patient here for evaluation of rash right buttock. I feel a lump and don't like sitting right now worsens pain. "I think it needs to be lanced"  My PC[facility] office [facility] is only doing video visits.  I am taking tetracycline twice a day right now and I think it is getting bigger. I have been doing epsom salt soaks.  I think it started from my radio clip rubbing on my skin I had shoved it in my waistband last week."  Denied fever/chills/drainage.  Patient with history of boils in armpit requiring lancing.  Pain 7/10;  Left toe pending surgery and is 10/10.  Patient does not want any pain medications at this time.     Review of Systems  Constitutional: Negative for activity change, appetite change, chills, diaphoresis, fatigue and fever.  HENT: Negative for trouble swallowing and voice change.   Eyes: Negative for photophobia and visual disturbance.  Respiratory: Negative for cough, shortness of breath, wheezing and stridor.   Cardiovascular: Negative for leg swelling.  Gastrointestinal: Negative for abdominal pain, diarrhea, nausea and vomiting.  Endocrine: Negative for cold intolerance and heat intolerance.  Genitourinary: Negative for difficulty urinating.  Musculoskeletal: Positive for myalgias. Negative for arthralgias, back pain, joint swelling, neck pain and neck stiffness.  Skin: Positive for color change and rash. Negative for pallor and wound.  Allergic/Immunologic: Positive for environmental allergies. Negative for food allergies.  Neurological: Negative for dizziness, tremors, seizures, syncope, facial asymmetry, speech difficulty, weakness, light-headedness, numbness and headaches.  Hematological: Negative for adenopathy. Does not bruise/bleed easily.  Psychiatric/Behavioral: Negative for agitation, confusion and sleep disturbance.         Objective:   Physical Exam Vitals signs reviewed.  Constitutional:      General: She is awake. She is not in acute distress.    Appearance: Normal appearance. She is well-developed and well-groomed. She is not ill-appearing, toxic-appearing or diaphoretic.  HENT:     Head: Normocephalic and atraumatic.     Jaw: There is normal jaw occlusion.     Right Ear: Hearing and external ear normal.     Left Ear: Hearing and external ear normal.     Nose: Nose normal.     Mouth/Throat:     Lips: Pink. No lesions.     Mouth: Mucous membranes are moist.     Tongue: No lesions. Tongue does not deviate from midline.     Palate: No mass and lesions.     Pharynx: Oropharynx is clear. Uvula midline.  Eyes:     General: Lids are normal. Vision grossly intact. Gaze aligned appropriately. Allergic shiner present. No visual field deficit or scleral icterus.    Extraocular Movements: Extraocular movements intact.     Conjunctiva/sclera: Conjunctivae normal.     Pupils: Pupils are equal, round, and reactive to light.  Neck:     Musculoskeletal: Normal range of motion and neck supple. No neck rigidity.     Thyroid: No thyromegaly.     Trachea: Trachea normal.  Cardiovascular:     Rate and Rhythm: Normal rate and regular rhythm.     Pulses: Normal pulses.          Radial pulses are 2+ on the right side and 2+ on the left side.     Heart sounds: Normal heart sounds. No murmur. No friction rub. No gallop.  Pulmonary:     Effort: Pulmonary effort is normal. No respiratory distress.     Breath sounds: Normal breath sounds and air entry. No stridor, decreased air movement or transmitted upper airway sounds. No decreased breath sounds, wheezing, rhonchi or rales.     Comments: Wearing buff cloth mask due to covid 19 pandemic; spoke full sentences without difficulty; no cough observed in exam room Abdominal:     General: Abdomen is flat.     Palpations: Abdomen is soft.  Musculoskeletal: Normal range of  motion.        General: Swelling and tenderness present. No deformity or signs of injury.     Right shoulder: Normal.     Left shoulder: Normal.     Right elbow: Normal.    Left elbow: Normal.     Right hip: Normal.     Left hip: Normal.     Right knee: Normal.     Left knee: Normal.     Right ankle: Normal.     Left ankle: Normal.     Cervical back: Normal.     Thoracic back: Normal.     Lumbar back: Normal.     Right hand: Normal.     Left hand: Normal.     Right lower leg: No edema.     Left lower leg: No edema.     Right foot: Normal.     Left foot: Normal range of motion and normal capillary refill. Tenderness and swelling present. No crepitus, deformity or laceration.  Lymphadenopathy:     Head:     Right side of head: No submental, submandibular, tonsillar, preauricular, posterior auricular or occipital adenopathy.     Left side of head: No submental, submandibular, tonsillar, preauricular, posterior auricular or occipital adenopathy.     Cervical: No cervical adenopathy.     Right cervical: No superficial cervical adenopathy.    Left cervical: No superficial cervical adenopathy.  Skin:    General: Skin is warm and dry.     Capillary Refill: Capillary refill takes less than 2 seconds.     Coloration: Skin is not ashen, cyanotic, jaundiced, mottled, pale or sallow.     Findings: Erythema and rash present. No abscess, acne, bruising, burn, ecchymosis, laceration, lesion, petechiae or wound. Rash is macular and scaling. Rash is not crusting, nodular, papular, purpuric, pustular, urticarial or vesicular.     Nails: There is no clubbing.           Comments: 1cm nummular indurated no fluctuance with perimeter scaling large; dry no discharge TTP firm nonmoveable right medial upper buttock  Neurological:     General: No focal deficit present.     Mental Status: She is alert and oriented to person, place, and time. Mental status is at baseline.     GCS: GCS eye subscore is 4.  GCS verbal subscore is 5. GCS motor subscore is 6.     Cranial Nerves: Cranial nerves are intact. No cranial nerve deficit, dysarthria or facial asymmetry.     Sensory: Sensation is intact. No sensory deficit.     Motor: Motor function is intact. No weakness, tremor, atrophy, abnormal muscle tone or seizure activity.     Coordination: Coordination is intact. Coordination normal.     Gait: Gait is intact. Gait normal.     Comments: Gait sure and steady hallway; in/out of chair without difficulty;  Prefers not to sit but sat for blood pressure check only 5 minutes  Psychiatric:  Attention and Perception: Attention and perception normal.        Mood and Affect: Mood and affect normal.        Speech: Speech normal.        Behavior: Behavior normal. Behavior is cooperative.        Thought Content: Thought content normal.        Cognition and Memory: Cognition and memory normal.        Judgment: Judgment normal.           Assessment & Plan:  A-cellulitis buttock initial visit  P-Continue tetracycline and add augmentin 875mg  po BID x 7 days #20 RF0 dispensed from [facility] to patient along with first aid spray apply BID then apply neosporin generic BID and cover with bandaid--give cloth bandaids and neosporin generic from clinic stock x 4.  Do not use hydrogen peroxide daily only with initial injury as slows wound healing.  Patient has diflucan at home if she needs prn yeast infection after oral antibiotics denied need for refill at this time. Once erythema decreasing/almost resolved can switch to vaseline/aquaphor instead of neosporin until fully healed.  May continue epsom salt soats.  Patient refused printed AVS.  Exitcare handout on skin infection. RTC if worsening erythema, pain, purulent discharge, fever after 48 hours of tetracycline and augmentin.  Discussed may sunburn easier with tetracycline wear protective clothing/sunscreen. Wash towels, washcloths, sheets in hot water with bleach  every couple of days until infection resolved. Wash area with soap and water at least daily.  Apply triple antibiotic or vaseline/aquaphor/petroleum jelly daily to BID with dressing change.  Cover with bandage until healed over.  Given bandaids and triple antibiotic ointment from clinic stock.   Patient verbalized understanding, agreed with plan of care and had no further questions at this time.   Patient may use normal saline nasal spray 2 sprays each nostril q2h wa as needed. flonase 39mcg 1 spray each nostril BID #48 RF2 electronic Rx sent to her pharmacy of choice.  Patient denied personal or family history of ENT cancer.  OTC antihistamine of choice claritin 10mg  po daily.  Avoid triggers if possible.  Shower prior to bedtime if exposed to triggers.  If allergic dust/dust mites recommend mattress/pillow covers/encasements; washing linens, vacuuming, sweeping, dusting weekly.  Call or return to clinic as needed if these symptoms worsen or fail to improve as anticipated.   Exitcare handout on allergic rhinitis and sinus rinse.  Patient verbalized understanding of instructions, agreed with plan of care and had no further questions at this time.  P2:  Avoidance and hand washing.

## 2019-01-29 NOTE — Patient Instructions (Addendum)
Cellulitis, Adult  Cellulitis is a skin infection. The infected area is usually warm, red, swollen, and tender. This condition occurs most often in the arms and lower legs. The infection can travel to the muscles, blood, and underlying tissue and become serious. It is very important to get treated for this condition. What are the causes? Cellulitis is caused by bacteria. The bacteria enter through a break in the skin, such as a cut, burn, insect bite, open sore, or crack. What increases the risk? This condition is more likely to occur in people who:  Have a weak body defense system (immune system).  Have open wounds on the skin, such as cuts, burns, bites, and scrapes. Bacteria can enter the body through these open wounds.  Are older than 45 years of age.  Have diabetes.  Have a type of long-lasting (chronic) liver disease (cirrhosis) or kidney disease.  Are obese.  Have a skin condition such as: ? Itchy rash (eczema). ? Slow movement of blood in the veins (venous stasis). ? Fluid buildup below the skin (edema).  Have had radiation therapy.  Use IV drugs. What are the signs or symptoms? Symptoms of this condition include:  Redness, streaking, or spotting on the skin.  Swollen area of the skin.  Tenderness or pain when an area of the skin is touched.  Warm skin.  A fever.  Chills.  Blisters. How is this diagnosed? This condition is diagnosed based on a medical history and physical exam. You may also have tests, including:  Blood tests.  Imaging tests. How is this treated? Treatment for this condition may include:  Medicines, such as antibiotic medicines or medicines to treat allergies (antihistamines).  Supportive care, such as rest and application of cold or warm cloths (compresses) to the skin.  Hospital care, if the condition is severe. The infection usually starts to get better within 1-2 days of treatment. Follow these instructions at home:  Medicines   Take over-the-counter and prescription medicines only as told by your health care provider.  If you were prescribed an antibiotic medicine, take it as told by your health care provider. Do not stop taking the antibiotic even if you start to feel better. General instructions  Drink enough fluid to keep your urine pale yellow.  Do not touch or rub the infected area.  Raise (elevate) the infected area above the level of your heart while you are sitting or lying down.  Apply warm or cold compresses to the affected area as told by your health care provider.  Keep all follow-up visits as told by your health care provider. This is important. These visits let your health care provider make sure a more serious infection is not developing. Contact a health care provider if:  You have a fever.  Your symptoms do not begin to improve within 1-2 days of starting treatment.  Your bone or joint underneath the infected area becomes painful after the skin has healed.  Your infection returns in the same area or another area.  You notice a swollen bump in the infected area.  You develop new symptoms.  You have a general ill feeling (malaise) with muscle aches and pains. Get help right away if:  Your symptoms get worse.  You feel very sleepy.  You develop vomiting or diarrhea that persists.  You notice red streaks coming from the infected area.  Your red area gets larger or turns dark in color. These symptoms may represent a serious problem that is an  emergency. Do not wait to see if the symptoms will go away. Get medical help right away. Call your local emergency services (911 in the [place] Do not drive yourself to the hospital. Summary  Cellulitis is a skin infection. This condition occurs most often in the arms and lower legs.  Treatment for this condition may include medicines, such as antibiotic medicines or antihistamines.  Take over-the-counter and prescription medicines only as  told by your health care provider. If you were prescribed an antibiotic medicine, do not stop taking the antibiotic even if you start to feel better.  Contact a health care provider if your symptoms do not begin to improve within 1-2 days of starting treatment or your symptoms get worse.  Keep all follow-up visits as told by your health care provider. This is important. These visits let your health care provider make sure that a more serious infection is not developing. This information is not intended to replace advice given to you by your health care provider. Make sure you discuss any questions you have with your health care provider. Document Released: 12/01/2004 Document Revised: 07/13/2017 Document Reviewed: 07/13/2017 Elsevier Patient Education  2020 [employer] Allergic Rhinitis, Adult Allergic rhinitis is an allergic reaction that affects the mucous membrane inside the nose. It causes sneezing, a runny or stuffy nose, and the feeling of mucus going down the back of the throat (postnasal drip). Allergic rhinitis can be mild to severe. There are two types of allergic rhinitis:  Seasonal. This type is also called hay fever. It happens only during certain seasons.  Perennial. This type can happen at any time of the year. What are the causes? This condition happens when the body's defense system (immune system) responds to certain harmless substances called allergens as though they were germs.  Seasonal allergic rhinitis is triggered by pollen, which can come from grasses, trees, and weeds. Perennial allergic rhinitis may be caused by:  House dust mites.  Pet dander.  Mold spores. What are the signs or symptoms? Symptoms of this condition include:  Sneezing.  Runny or stuffy nose (nasal congestion).  Postnasal drip.  Itchy nose.  Tearing of the eyes.  Trouble sleeping.  Daytime sleepiness. How is this diagnosed? This condition may be diagnosed based on:  Your medical  history.  A physical exam.  Tests to check for related conditions, such as: ? Asthma. ? Pink eye. ? Ear infection. ? Upper respiratory infection.  Tests to find out which allergens trigger your symptoms. These may include skin or blood tests. How is this treated? There is no cure for this condition, but treatment can help control symptoms. Treatment may include:  Taking medicines that block allergy symptoms, such as antihistamines. Medicine may be given as a shot, nasal spray, or pill.  Avoiding the allergen.  Desensitization. This treatment involves getting ongoing shots until your body becomes less sensitive to the allergen. This treatment may be done if other treatments do not help.  If taking medicine and avoiding the allergen does not work, new, stronger medicines may be prescribed. Follow these instructions at home:  Find out what you are allergic to. Common allergens include smoke, dust, and pollen.  Avoid the things you are allergic to. These are some things you can do to help avoid allergens: ? Replace carpet with wood, tile, or vinyl flooring. Carpet can trap dander and dust. ? Do not smoke. Do not allow smoking in your home. ? Change your heating and air conditioning filter at  least once a month. ? During allergy season:  Keep windows closed as much as possible.  Plan outdoor activities when pollen counts are lowest. This is usually during the evening hours.  When coming indoors, change clothing and shower before sitting on furniture or bedding.  Take over-the-counter and prescription medicines only as told by your health care provider.  Keep all follow-up visits as told by your health care provider. This is important. Contact a health care provider if:  You have a fever.  You develop a persistent cough.  You make whistling sounds when you breathe (you wheeze).  Your symptoms interfere with your normal daily activities. Get help right away if:  You have  shortness of breath. Summary  This condition can be managed by taking medicines as directed and avoiding allergens.  Contact your health care provider if you develop a persistent cough or fever.  During allergy season, keep windows closed as much as possible. This information is not intended to replace advice given to you by your health care provider. Make sure you discuss any questions you have with your health care provider. Document Released: 11/16/2000 Document Revised: 02/03/2017 Document Reviewed: 03/31/2016 Elsevier Patient Education  2020 [employer].  How to Perform a Sinus Rinse A sinus rinse is a home treatment that is used to rinse your sinuses with a sterile mixture of salt and water (saline solution). Sinuses are air-filled spaces in your skull behind the bones of your face and forehead that open into your nasal cavity. A sinus rinse can help to clear mucus, dirt, dust, or pollen from your nasal cavity. You may do a sinus rinse when you have a cold, a virus, nasal allergy symptoms, a sinus infection, or stuffiness in your nose or sinuses. Talk with your health care provider about whether a sinus rinse might help you. What are the risks? A sinus rinse is generally safe and effective. However, there are a few risks, which include:  A burning sensation in your sinuses. This may happen if you do not make the saline solution as directed. Be sure to follow all directions when making the saline solution.  Nasal irritation.  Infection from contaminated water. This is rare, but possible. Do not do a sinus rinse if you have had ear or nasal surgery, ear infection, or blocked ears. Supplies needed:  Saline solution or powder.  Distilled or sterile water may be needed to mix with saline powder. ? You may use boiled and cooled tap water. Boil tap water for 5 minutes; cool until it is lukewarm. Use within 24 hours. ? Do not use regular tap water to mix with the saline solution.   Neti pot or nasal rinse bottle. These supplies release the saline solution into your nose and through your sinuses. Neti pots and nasal rinse bottles can be purchased at [employer], a health food store, or online. How to perform a sinus rinse  1. Wash your hands with soap and water. 2. Wash your device according to the directions that came with the product and then dry it. 3. Use the solution that comes with your product or one that is sold separately in stores. Follow the mixing directions on the package if you need to mix with sterile or distilled water. 4. Fill the device with the amount of saline solution noted in the device instructions. 5. Stand over a sink and tilt your head sideways over the sink. 6. Place the spout of the device in your upper  nostril (the one closer to the ceiling). 7. Gently pour or squeeze the saline solution into your nasal cavity. The liquid should drain out from the lower nostril if you are not too congested. 8. While rinsing, breathe through your open mouth. 9. Gently blow your nose to clear any mucus and rinse solution. Blowing too hard may cause ear pain. 10. Repeat in your other nostril. 11. Clean and rinse your device with clean water and then air-dry it. Talk with your health care provider or pharmacist if you have questions about how to do a sinus rinse. Summary  A sinus rinse is a home treatment that is used to rinse your sinuses with a sterile mixture of salt and water (saline solution).  A sinus rinse is generally safe and effective. Follow all instructions carefully.  Before doing a sinus rinse, talk with your health care provider about whether it would be helpful for you. This information is not intended to replace advice given to you by your health care provider. Make sure you discuss any questions you have with your health care provider. Document Released: 09/18/2013 Document Revised: 12/19/2016 Document Reviewed: 12/19/2016 Elsevier  Patient Education  2020 [employer].

## 2019-02-06 ENCOUNTER — Ambulatory Visit (INDEPENDENT_AMBULATORY_CARE_PROVIDER_SITE_OTHER): Payer: PRIVATE HEALTH INSURANCE

## 2019-02-06 ENCOUNTER — Other Ambulatory Visit: Payer: Self-pay

## 2019-02-06 ENCOUNTER — Ambulatory Visit: Payer: PRIVATE HEALTH INSURANCE | Admitting: Podiatry

## 2019-02-06 ENCOUNTER — Other Ambulatory Visit: Payer: Self-pay | Admitting: Podiatry

## 2019-02-06 DIAGNOSIS — M2042 Other hammer toe(s) (acquired), left foot: Secondary | ICD-10-CM

## 2019-02-06 NOTE — Patient Instructions (Signed)
Pre-Operative Instructions  Congratulations, you have decided to take an important step towards improving your quality of life.  You can be assured that the doctors and staff at [facility] will be with you every step of the way.  Here are some important things you should know:  1. Plan to be at the surgery center/hospital at least 1 (one) hour prior to your scheduled time, unless otherwise directed by the surgical center/hospital staff.  You must have a responsible adult accompany you, remain during the surgery and drive you home.  Make sure you have directions to the surgical center/hospital to ensure you arrive on time. 2. If you are having surgery at [facility] or [facility], you will need a copy of your medical history and physical form from your family physician within one month prior to the date of surgery. We will give you a form for your primary physician to complete.  3. We make every effort to accommodate the date you request for surgery.  However, there are times where surgery dates or times have to be moved.  We will contact you as soon as possible if a change in schedule is required.   4. No aspirin/ibuprofen for one week before surgery.  If you are on aspirin, any non-steroidal anti-inflammatory medications (Mobic, Aleve, Ibuprofen) should not be taken seven (7) days prior to your surgery.  You make take Tylenol for pain prior to surgery.  5. Medications - If you are taking daily heart and blood pressure medications, seizure, reflux, allergy, asthma, anxiety, pain or diabetes medications, make sure you notify the surgery center/hospital before the day of surgery so they can tell you which medications you should take or avoid the day of surgery. 6. No food or drink after midnight the night before surgery unless directed otherwise by surgical center/hospital staff. 7. No alcoholic beverages 24-hours prior to surgery.  No smoking 24-hours prior or 24-hours after  surgery. 8. Wear loose pants or shorts. They should be loose enough to fit over bandages, boots, and casts. 9. Don't wear slip-on shoes. Sneakers are preferred. 10. Bring your boot with you to the surgery center/hospital.  Also bring crutches or a walker if your physician has prescribed it for you.  If you do not have this equipment, it will be provided for you after surgery. 11. If you have not been contacted by the surgery center/hospital by the day before your surgery, call to confirm the date and time of your surgery. 12. Leave-time from work may vary depending on the type of surgery you have.  Appropriate arrangements should be made prior to surgery with your employer. 13. Prescriptions will be provided immediately following surgery by your doctor.  Fill these as soon as possible after surgery and take the medication as directed. Pain medications will not be refilled on weekends and must be approved by the doctor. 14. Remove nail polish on the operative foot and avoid getting pedicures prior to surgery. 15. Wash the night before surgery.  The night before surgery wash the foot and leg well with water and the antibacterial soap provided. Be sure to pay special attention to beneath the toenails and in between the toes.  Wash for at least three (3) minutes. Rinse thoroughly with water and dry well with a towel.  Perform this wash unless told not to do so by your physician.  Enclosed: 1 Ice pack (please put in freezer the night before surgery)   1 Hibiclens skin cleaner     Pre-op instructions  If you have any questions regarding the instructions, please do not hesitate to call our office.  [place]: [address], [place], [place] [address] -- [phone]  [place]: [address] [place], [place] [address] -- [phone]  [place]: [address]  [place], [place] [address] -- [phone]   Website: [email or web address] 

## 2019-02-10 NOTE — Progress Notes (Signed)
   HPI: 43 y.o. female presenting today as a new patient with a chief complaint of intermittent burning pain noted to the left third toe that began about six months ago. She reports associated redness and swelling. Walking increases the pain. She has not had any treatment for the symptoms. She has been seen by Dr. Gershon Mussel and was told it was a nerve problem. Patient is here for further evaluation and treatment.   Past Medical History:  Diagnosis Date  . IBS (irritable bowel syndrome)    colonoscopy around 2012 [facility]).   . Leukocytosis, unspecified 06/11/2012  . Skin infection       Objective: Physical Exam General: The patient is alert and oriented x3 in no acute distress.  Dermatology: Skin is cool, dry and supple bilateral lower extremities. Negative for open lesions or macerations.  Vascular: Palpable pedal pulses bilaterally. No edema or erythema noted. Capillary refill within normal limits.  Neurological: Epicritic and protective threshold grossly intact bilaterally.   Musculoskeletal Exam: All pedal and ankle joints range of motion within normal limits bilateral. Muscle strength 5/5 in all groups bilateral. Hammertoe contracture deformity noted to digits 3, 4 and 5 of the left foot. Hammertoe repair of digits 2, 3 and 4 noted to the right foot.   Radiographic Exam: Hammertoe contracture deformity noted to the interphalangeal joints and MPJ of the respective hammertoe digits mentioned on clinical musculoskeletal exam.     Assessment: 1. Hammertoe contracture digits 3, 4 and 5 left 2. H/o hammertoe repair digits 2, 3 and 4 right    Plan of Care:  1. Patient evaluated. X-Rays reviewed.  2. Today we discussed the conservative versus surgical management of the presenting pathology. The patient opts for surgical management. All possible complications and details of the procedure were explained. All patient questions were answered. No guarantees were expressed or implied. 3.  Authorization for surgery was initiated today. Surgery will consist of transverse PIPJ arthroplasty digits 3, 5 left; DIPJ 4th digit left.  4. Return to clinic one week post op. She should be able to return in 4 weeks to work.   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     Edrick Kins, DPM [facility]  Dr. Edrick Kins, DPM    [address], [place] [address]                Office [phone]  Fax [phone]

## 2019-03-29 ENCOUNTER — Telehealth: Payer: Self-pay | Admitting: *Deleted

## 2019-03-29 NOTE — Telephone Encounter (Signed)
DOS 04/04/2019 HAMMER TOE REPAIR 3,4,5 LEFT FOOT - [id number]  MEDCOST: Eligibility Date - 03/07/2012 - active  Deductible - $1500 / $0 met Co-insurance - 80% Out of pocket - $5000 / $148.32 met  Prior authorization is NOT REQUIRED.  Call Ref# is [id number]

## 2019-04-04 ENCOUNTER — Encounter: Payer: Self-pay | Admitting: Podiatry

## 2019-04-04 ENCOUNTER — Other Ambulatory Visit: Payer: Self-pay | Admitting: Podiatry

## 2019-04-04 DIAGNOSIS — M2042 Other hammer toe(s) (acquired), left foot: Secondary | ICD-10-CM | POA: Diagnosis not present

## 2019-04-04 MED ORDER — OXYCODONE-ACETAMINOPHEN 5-325 MG PO TABS: 1.0000 | ORAL_TABLET | Freq: Four times a day (QID) | ORAL | 0 refills | Status: DC | PRN

## 2019-04-08 ENCOUNTER — Ambulatory Visit (INDEPENDENT_AMBULATORY_CARE_PROVIDER_SITE_OTHER): Payer: PRIVATE HEALTH INSURANCE

## 2019-04-08 ENCOUNTER — Ambulatory Visit (INDEPENDENT_AMBULATORY_CARE_PROVIDER_SITE_OTHER): Payer: PRIVATE HEALTH INSURANCE | Admitting: Podiatry

## 2019-04-08 ENCOUNTER — Other Ambulatory Visit: Payer: Self-pay

## 2019-04-08 DIAGNOSIS — M2042 Other hammer toe(s) (acquired), left foot: Secondary | ICD-10-CM | POA: Diagnosis not present

## 2019-04-08 DIAGNOSIS — Z9889 Other specified postprocedural states: Secondary | ICD-10-CM

## 2019-04-11 NOTE — Progress Notes (Signed)
   Subjective:  Patient presents today status post hammertoe repair digits 3, 4 and 5 left. DOS: 04/04/2019. She states she is doing well. She denies any pain or modifying factors. She denies needing any pain medication for the past 24 hours. She has ben using the CAM boot as directed. She denies fever, chills, nausea. Patient is here for further evaluation and treatment.   Past Medical History:  Diagnosis Date  . IBS (irritable bowel syndrome)    colonoscopy around 2012 [facility]).   . Leukocytosis, unspecified 06/11/2012  . Skin infection       Objective/Physical Exam Neurovascular status intact.  Skin incisions appear to be well coapted with sutures and staples intact. No sign of infectious process noted. No dehiscence. No active bleeding noted. Moderate edema noted to the surgical extremity.  Radiographic Exam:  Orthopedic hardware and osteotomies sites appear to be stable with routine healing.  Assessment: 1. s/p hammertoe repair digits 3, 4 ,and 5 left. DOS: 04/04/2019 2. H/o hammertoe repair digits 2, 3 and 4 right    Plan of Care:  1. Patient was evaluated. X-rays reviewed 2. Dressing changed. Keep clean, dry and intact for one week.  3. Continue weightbearing in CAM boot.  4. Return to clinic in one week for suture removal.   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    Felecia Shelling, DPM [facility]  Dr. Felecia Shelling, DPM    [address]                                        [place], [place] [address]                Office [phone]  Fax [phone]

## 2019-04-17 ENCOUNTER — Ambulatory Visit (INDEPENDENT_AMBULATORY_CARE_PROVIDER_SITE_OTHER): Payer: PRIVATE HEALTH INSURANCE | Admitting: Podiatry

## 2019-04-17 ENCOUNTER — Other Ambulatory Visit: Payer: Self-pay

## 2019-04-17 ENCOUNTER — Encounter: Payer: Self-pay | Admitting: Podiatry

## 2019-04-17 DIAGNOSIS — Z9889 Other specified postprocedural states: Secondary | ICD-10-CM

## 2019-04-17 DIAGNOSIS — M2042 Other hammer toe(s) (acquired), left foot: Secondary | ICD-10-CM

## 2019-04-21 NOTE — Progress Notes (Signed)
   Subjective:  Patient presents today status post hammertoe repair digits 3, 4 and 5 left. DOS: 04/04/2019. She states she is doing well. She denies any significant pain or modifying factors. She has been using the post op shoe as directed. She denies any other concerns or complaints at this time. Patient is here for further evaluation and treatment.   Past Medical History:  Diagnosis Date  . IBS (irritable bowel syndrome)    colonoscopy around 2012 [facility]).   . Leukocytosis, unspecified 06/11/2012  . Skin infection       Objective/Physical Exam Neurovascular status intact.  Skin incisions appear to be well coapted with sutures and staples intact. No sign of infectious process noted. No dehiscence. No active bleeding noted. Moderate edema noted to the surgical extremity.  Assessment: 1. s/p hammertoe repair digits 3, 4 ,and 5 left. DOS: 04/04/2019 2. H/o hammertoe repair digits 2, 3 and 4 right    Plan of Care:  1. Patient was evaluated.  2. Sutures removed.  3. Percutaneous pins removed.  4. Continue using post op shoe.  5. Return to clinic in 2 weeks.    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    Felecia Shelling, DPM [facility]  Dr. Felecia Shelling, DPM    [address]                                        [place], [place] [address]                Office [phone]  Fax [phone]

## 2019-05-01 ENCOUNTER — Other Ambulatory Visit: Payer: Self-pay

## 2019-05-01 ENCOUNTER — Encounter: Payer: Self-pay | Admitting: Podiatry

## 2019-05-01 ENCOUNTER — Ambulatory Visit (INDEPENDENT_AMBULATORY_CARE_PROVIDER_SITE_OTHER): Payer: PRIVATE HEALTH INSURANCE | Admitting: Podiatry

## 2019-05-01 DIAGNOSIS — Z9889 Other specified postprocedural states: Secondary | ICD-10-CM

## 2019-05-01 DIAGNOSIS — M2042 Other hammer toe(s) (acquired), left foot: Secondary | ICD-10-CM

## 2019-05-15 NOTE — Progress Notes (Signed)
   Subjective:  Patient presents today status post hammertoe repair digits 3, 4 and 5 left. DOS: 04/04/2019. She states she is doing well and has significantly improved. She reports some intermittent swelling that is alleviated by elevating the foot. She has been using the CAM boot as directed. Patient is here for further evaluation and treatment.   Past Medical History:  Diagnosis Date  . IBS (irritable bowel syndrome)    colonoscopy around 2012 [facility]).   . Leukocytosis, unspecified 06/11/2012  . Skin infection       Objective/Physical Exam Neurovascular status intact.  Skin incisions appear to be well coapted. No sign of infectious process noted. No dehiscence. No active bleeding noted. Moderate edema noted to the surgical extremity.  Assessment: 1. s/p hammertoe repair digits 3, 4 ,and 5 left. DOS: 04/04/2019 2. H/o hammertoe repair digits 2, 3 and 4 right    Plan of Care:  1. Patient was evaluated.  2. Transition out of CAM boot into post op shoe over the next 2 weeks.  3. Note for work for light duty only.  4. Return to clinic in 4 weeks.   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    Felecia Shelling, DPM [facility]  Dr. Felecia Shelling, DPM    [address]                                        [place], [place] [address]                Office [phone]  Fax [phone]

## 2019-05-29 ENCOUNTER — Other Ambulatory Visit: Payer: Self-pay

## 2019-05-29 ENCOUNTER — Ambulatory Visit (INDEPENDENT_AMBULATORY_CARE_PROVIDER_SITE_OTHER): Payer: PRIVATE HEALTH INSURANCE | Admitting: Podiatry

## 2019-05-29 DIAGNOSIS — M2042 Other hammer toe(s) (acquired), left foot: Secondary | ICD-10-CM

## 2019-05-29 DIAGNOSIS — Z9889 Other specified postprocedural states: Secondary | ICD-10-CM

## 2019-05-29 MED ORDER — MELOXICAM 15 MG PO TABS: 15.0000 mg | ORAL_TABLET | Freq: Every day | ORAL | 1 refills | Status: DC

## 2019-06-02 NOTE — Progress Notes (Signed)
   Subjective:  Patient presents today status post hammertoe repair digits 3, 4 and 5 left. DOS: 04/04/2019. She reports severe sharp, burning pain at work because she is being required to work full duty without restrictions. She states she is ambulatory with a limp. She has been using the post op shoe as directed. Patient is here for further evaluation and treatment.   Past Medical History:  Diagnosis Date  . IBS (irritable bowel syndrome)    colonoscopy around 2012 [facility]).   . Leukocytosis, unspecified 06/11/2012  . Skin infection       Objective/Physical Exam Neurovascular status intact.  Skin incisions appear to be well coapted. No sign of infectious process noted. No dehiscence. No active bleeding noted. Moderate edema noted to the surgical extremity.  Assessment: 1. s/p hammertoe repair digits 3, 4 ,and 5 left. DOS: 04/04/2019 2. H/o hammertoe repair digits 2, 3 and 4 right    Plan of Care:  1. Patient was evaluated.  2. Prescription for Meloxicam provided to patient. 3. Recommended good shoe gear.  4. Continue light duty for four weeks.  5. Return to clinic in 4 weeks for final follow up and return to work full duty.   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    Felecia Shelling, DPM [facility]  Dr. Felecia Shelling, DPM    [address]                                        [place], [place] [address]                Office [phone]  Fax [phone]

## 2019-06-26 ENCOUNTER — Ambulatory Visit (INDEPENDENT_AMBULATORY_CARE_PROVIDER_SITE_OTHER): Payer: PRIVATE HEALTH INSURANCE | Admitting: Podiatry

## 2019-06-26 ENCOUNTER — Encounter: Payer: Self-pay | Admitting: Podiatry

## 2019-06-26 ENCOUNTER — Ambulatory Visit (INDEPENDENT_AMBULATORY_CARE_PROVIDER_SITE_OTHER): Payer: PRIVATE HEALTH INSURANCE

## 2019-06-26 ENCOUNTER — Other Ambulatory Visit: Payer: Self-pay

## 2019-06-26 DIAGNOSIS — Z9889 Other specified postprocedural states: Secondary | ICD-10-CM

## 2019-06-26 DIAGNOSIS — M2042 Other hammer toe(s) (acquired), left foot: Secondary | ICD-10-CM | POA: Diagnosis not present

## 2019-07-01 NOTE — Progress Notes (Signed)
   Subjective:  Patient presents today status post hammertoe repair digits 3, 4 and 5 left. DOS: 04/04/2019. She reports she is about 50% better. She reports continued burning of the distal 3rd and 4th digits. She reports some soreness of the lateral aspect of the 5th digit. She reports a decrease in range of motion. Patient is here for further evaluation and treatment.   Past Medical History:  Diagnosis Date  . IBS (irritable bowel syndrome)    colonoscopy around 2012 [facility]).   . Leukocytosis, unspecified 06/11/2012  . Skin infection       Objective/Physical Exam Neurovascular status intact.  Skin incisions appear to be well coapted. No sign of infectious process noted. No dehiscence. No active bleeding noted. Moderate edema noted to the surgical extremity.  Radiographic Exam:  Orthopedic hardware and osteotomies sites appear to be stable with routine healing.  Assessment: 1. s/p hammertoe repair digits 3, 4 ,and 5 left. DOS: 04/04/2019 2. H/o hammertoe repair digits 2, 3 and 4 right    Plan of Care:  1. Patient was evaluated. X-Rays reviewed.  2. Recommended daily ROM exercises to toes.  3. May resume full activity with no restrictions.  4. Recommended good shoe gear.  5. Return to clinic as needed.    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    Felecia Shelling, DPM [facility]  Dr. Felecia Shelling, DPM    [address]                                        [place], [place] [address]                Office [phone]  Fax [phone]

## 2019-07-11 ENCOUNTER — Other Ambulatory Visit: Payer: Self-pay | Admitting: Family Medicine

## 2019-07-11 DIAGNOSIS — N63 Unspecified lump in unspecified breast: Secondary | ICD-10-CM

## 2019-08-07 ENCOUNTER — Other Ambulatory Visit: Payer: Self-pay

## 2019-08-07 ENCOUNTER — Ambulatory Visit: Payer: PRIVATE HEALTH INSURANCE

## 2019-08-07 ENCOUNTER — Ambulatory Visit
Admission: RE | Admit: 2019-08-07 | Discharge: 2019-08-07 | Disposition: A | Discharge status: Home / Self Care | Payer: PRIVATE HEALTH INSURANCE | Source: Ambulatory Visit | Attending: Family Medicine | Admitting: Family Medicine

## 2019-08-07 DIAGNOSIS — N63 Unspecified lump in unspecified breast: Secondary | ICD-10-CM

## 2019-08-27 ENCOUNTER — Other Ambulatory Visit: Payer: Self-pay

## 2019-08-27 ENCOUNTER — Encounter: Payer: Self-pay | Admitting: Registered Nurse

## 2019-08-27 ENCOUNTER — Ambulatory Visit: Payer: Self-pay | Admitting: Registered Nurse

## 2019-08-27 VITALS — BP 138/80 | HR 93 | Temp 98.4°F

## 2019-08-27 DIAGNOSIS — M62838 Other muscle spasm: Secondary | ICD-10-CM

## 2019-08-27 DIAGNOSIS — S161XXA Strain of muscle, fascia and tendon at neck level, initial encounter: Secondary | ICD-10-CM

## 2019-08-27 DIAGNOSIS — M778 Other enthesopathies, not elsewhere classified: Secondary | ICD-10-CM

## 2019-08-27 MED ORDER — CYCLOBENZAPRINE HCL 10 MG PO TABS: 10.0000 mg | ORAL_TABLET | Freq: Two times a day (BID) | ORAL | 0 refills | Status: AC | PRN

## 2019-08-27 MED ORDER — SALINE SPRAY 0.65 % NA SOLN: 2.0000 | NASAL | 0 refills | Status: DC

## 2019-08-27 MED ORDER — IBUPROFEN 800 MG PO TABS: 800.0000 mg | ORAL_TABLET | Freq: Three times a day (TID) | ORAL | 0 refills | Status: AC | PRN

## 2019-08-27 MED ORDER — BIOFREEZE 4 % EX GEL: 1.0000 "application " | Freq: Four times a day (QID) | CUTANEOUS | Status: AC | PRN

## 2019-08-27 MED ORDER — FLUTICASONE PROPIONATE 50 MCG/ACT NA SUSP: 1.0000 | Freq: Two times a day (BID) | NASAL | 2 refills | Status: DC

## 2019-08-27 NOTE — Progress Notes (Signed)
Subjective:    Patient ID: Sydney Spencer, female    DOB: 17-Mar-1975, 44 y.o.   MRN: [id number]  44y/o established female pt c/o L sided neck pain. It has been present for about 2 months but worsened 4 days ago when it woke her from sleep. Has noticed popping with neck movements and snapping in right shoulder when raising right hand above head.  Right hand dominant.  States neck feels tight and like it is pushing her head forward. 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  Left side neck worse than left; sometimes feels like swollen left side to base of skull.  Denied headache, visual changes.  Denied arm/leg weakness, tingling or numbness or saddle paresthesias/loss of bowel/bladder control.  Tried THC cream, heat, ice, stretching, tiger balm.  Helps some but doesn't relieve symptoms completely.  Has used meloxicam, motrin, tylenol in the past for other injuries.     Review of Systems  Constitutional: Positive for activity change. Negative for appetite change, chills, diaphoresis, fatigue, fever and unexpected weight change.  HENT: Negative for ear pain, facial swelling, trouble swallowing and voice change.   Eyes: Negative for photophobia, pain, discharge, redness, itching and visual disturbance.  Respiratory: Negative for cough, shortness of breath, wheezing and stridor.   Cardiovascular: Negative for palpitations.  Gastrointestinal: Negative for abdominal pain, constipation, diarrhea, nausea and vomiting.  Endocrine: Negative for cold intolerance and heat intolerance.  Genitourinary: Negative for difficulty urinating and enuresis.  Musculoskeletal: Positive for arthralgias, joint swelling, myalgias and neck pain. Negative for  back pain, gait problem and neck stiffness.  Skin: Negative for color change, pallor, rash and wound.  Allergic/Immunologic: Negative for food allergies.  Neurological: Negative for dizziness, tremors, seizures, syncope, facial asymmetry, speech difficulty, weakness, light-headedness and numbness.  Hematological: Negative for adenopathy. Does not bruise/bleed easily.  Psychiatric/Behavioral: Positive for sleep disturbance. Negative for agitation and confusion.       Objective:   Physical Exam Vitals and nursing note reviewed.  Constitutional:      General: She is awake. She is not in acute distress.    Appearance: Normal appearance. She is well-developed, well-groomed and normal weight. She is not ill-appearing, toxic-appearing or diaphoretic.  HENT:     Head: Normocephalic and atraumatic.     Jaw: There is normal jaw occlusion.     Salivary Glands: Right salivary gland is not diffusely enlarged or tender. Left salivary gland is not diffusely enlarged or tender.     Right Ear: Hearing and external ear normal.     Left Ear: Hearing and external ear normal.     Nose: Nose normal. No congestion or rhinorrhea.     Mouth/Throat:     Mouth: No angioedema.     Pharynx: Oropharynx is clear.  Eyes:     General: Lids are normal. Vision grossly intact. Gaze aligned appropriately. Allergic shiner present. No visual field deficit or scleral icterus.       Right eye: No discharge.        Left eye: No discharge.     Extraocular Movements: Extraocular movements intact.     Conjunctiva/sclera: Conjunctivae normal.     Pupils: Pupils are equal, round, and reactive to light.  Neck:     Thyroid: No thyroid mass,  thyromegaly or thyroid tenderness.     Vascular: No JVD.     Trachea: Trachea and phonation normal. No tracheal tenderness or tracheal deviation.   Cardiovascular:     Rate and Rhythm: Normal rate and regular rhythm.     Pulses: Normal pulses.          Radial pulses are 2+ on the right  side and 2+ on the left side.  Pulmonary:     Effort: Pulmonary effort is normal. No respiratory distress.     Breath sounds: Normal breath sounds and air entry. No stridor, decreased air movement or transmitted upper airway sounds. No decreased breath sounds, wheezing, rhonchi or rales.     Comments: Wearing buff cloth due to covid 19 pandemic; spoke full sentences without difficulty; no cough observed in exam room Abdominal:     General: Abdomen is flat. There is no distension.     Palpations: Abdomen is soft.     Tenderness: There is no abdominal tenderness.  Musculoskeletal:        General: No swelling, tenderness, deformity or signs of injury.     Right shoulder: Crepitus present. No swelling, deformity, effusion, laceration, tenderness or bony tenderness. Normal range of motion. Normal strength. Normal pulse.     Left shoulder: Normal. No swelling, deformity, effusion, laceration, tenderness, bony tenderness or crepitus. Normal range of motion. Normal strength. Normal pulse.     Right upper arm: Normal. No swelling, edema, deformity, lacerations, tenderness or bony tenderness.     Left upper arm: Normal. No swelling, edema, deformity, lacerations, tenderness or bony tenderness.     Right elbow: Normal. No swelling, deformity, effusion or lacerations. Normal range of motion. No tenderness.     Left elbow: Normal. No swelling, deformity, effusion or lacerations. Normal range of motion. No tenderness.     Right forearm: Normal.     Left forearm: Normal.     Right wrist: Normal. No swelling, deformity, effusion, lacerations or crepitus. Normal range of motion.     Left wrist: Normal. No swelling, deformity, effusion, lacerations or crepitus. Normal range of motion.     Right hand: Normal. No swelling, deformity or lacerations. Normal range of motion. Normal strength. Normal capillary refill. Normal pulse.     Left hand: Normal. No swelling, deformity or lacerations. Normal range of motion.  Normal strength. Normal capillary refill. Normal pulse.       Arms:     Cervical back: Neck supple. Crepitus present. No edema, erythema, rigidity, torticollis or tenderness. Pain with movement present. No spinous process tenderness or muscular tenderness. Decreased range of motion.     Thoracic back: Normal.     Lumbar back: Bony tenderness present. No swelling, edema, deformity, lacerations or spasms. Normal range of motion.     Right hip: Normal.     Left hip: No deformity, lacerations, bony tenderness or crepitus. Normal range of motion. Normal strength.     Right upper leg: Normal.     Left upper leg: No swelling, edema, deformity, lacerations or bony tenderness.     Right knee: Normal.     Left knee: Normal.     Right lower leg: Normal. No edema.     Left lower leg: Normal. No edema.     Right ankle: Normal.     Left ankle: Normal.     Comments: Negative atchley scratch/internal/external rotation equal bilaterally; positive neers right; right flexion slower than left due to pain able to bring to 180 degrees but  slow compared to left and left without any pain with AROM  Lymphadenopathy:     Head:     Right side of head: No submental, submandibular, tonsillar, preauricular, posterior auricular or occipital adenopathy.     Left side of head: No submental, submandibular, tonsillar, preauricular, posterior auricular or occipital adenopathy.     Cervical: No cervical adenopathy.     Right cervical: No superficial, deep or posterior cervical adenopathy.    Left cervical: No superficial, deep or posterior cervical adenopathy.  Skin:    General: Skin is warm and dry.     Capillary Refill: Capillary refill takes less than 2 seconds.     Coloration: Skin is not ashen, cyanotic, jaundiced, mottled, pale or sallow.     Findings: No abrasion, abscess, acne, bruising, burn, ecchymosis, erythema, signs of injury, laceration, lesion, petechiae, rash or wound.     Nails: There is no clubbing.   Neurological:     General: No focal deficit present.     Mental Status: She is alert and oriented to person, place, and time. Mental status is at baseline. She is not disoriented.     GCS: GCS eye subscore is 4. GCS verbal subscore is 5. GCS motor subscore is 6.     Cranial Nerves: Cranial nerves are intact. No cranial nerve deficit, dysarthria or facial asymmetry.     Sensory: Sensation is intact. No sensory deficit.     Motor: Motor function is intact. No weakness, tremor, atrophy, abnormal muscle tone or seizure activity.     Coordination: Coordination is intact. Coordination normal.     Gait: Gait is intact. Gait normal.     Deep Tendon Reflexes: Reflexes normal.     Comments: Strength 5/5 bilaterally arms/legs; gait sure and steady in hallway; on/off exam table and in/out of chair without difficulty  Psychiatric:        Attention and Perception: Attention and perception normal. She is attentive.        Mood and Affect: Mood and affect normal.        Speech: Speech normal.        Behavior: Behavior normal. Behavior is cooperative.        Thought Content: Thought content normal.        Cognition and Memory: Cognition and memory normal.        Judgment: Judgment normal.      1245  Patient seen in parking lot and reported [facility] has helped to loosen up neck and shoulder muscles since application in clinic.     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  cyclobenazeprine/flexeril 10mg  sig t1/2-1 po qhs prn muscle spasms #30 RF0 dispensed from [facility].  Ibuprofen 800mg  po TID  prn pain #30 RF0 dispensed from [facility] take with food to avoid/minimize gastritis x 2 weeks.  Discussed no use meloxicam/mobic/naproxen/naprosyn/aleve/advil/motrin while taking ibuprofen as they are all in the same family.  Patient stated meloxicam did not work any better than motrin in the past and she preferred motrin this time since we have in clinic.    May use tylenol 1000mg  po QID prn pain still.  Avoid alcohol intake and driving while taking cyclobenazeprine/flexeril as drowsiness common side effect.  Slow position changes as medication also lowers blood pressure eg move lying to sitting then standing.  Home stretches demonstrated to patient-e.g. Arm circles/pendulums, spiders walking up wall, chest stretches, neck AROM (flexion/extension/lateral bending/neck circles), chin tucks. Self massage or professional prn, foam roller use or tennis/racquetball.  Heat/cryotherapy 15 minutes QID prn.  Trial [facility] 1 cervical applied in clnic and another given to patient for use tomorrow from clinic stock.  Biofreeze gel may apply QID prn pain.  Given 4 UD from clinic stock.    Discussed with patient THC/CBD creams/ointments could cause positive drug test at work unless third party tested for purity and low to no THC e.g. less than 1%.  I recommended not to use since her job requires drug testing in injury/accident at work.  May continue tiger balm use.  Trial epsom salt soaks after work in tub  Consider physical therapy referral if no improvement with prescribed therapy from [facility] and/or chiropractic care.  Ensure ergonomics correct with lifting.  Buddy lifting at work when greater than 40 lbs.  When working at desk at work avoid repetitive motions if possible/holding phone/laptop in hand use desk/stand, headset/speakerphone and/or break up lifting items into smaller loads/weights.  Patient was instructed to rest, ice, and ROM exercises.  Activity as tolerated.  Discussed the longer tendonitis not treated longer it  takes to resolve and may need to see orthopedics for steroid injection.   Follow up if symptoms persist or worsen especially if loss of bowel/bladder control, arm/leg weakness and/or saddle paresthesias requires same day evaluation.  Follow up in 2 weeks for re-evaluation.  Should notice improvement in symptoms slow and gradual over time with stretches/medication/rest/heat/ice.    [facility] handouts on cervical strain/rehab exercises/tendonitis/muscle spasms printed and given to patient.  Patient verbalized agreement and understanding of treatment plan and had no further questions at this time.  P2:  Injury Prevention and Fitness.

## 2019-08-27 NOTE — Patient Instructions (Signed)
Tendinitis  Tendinitis is inflammation of a tendon. A tendon is a strong cord of tissue that connects muscle to bone. Tendinitis can affect any tendon, but it most commonly affects the:  Shoulder tendon (rotator cuff).  Ankle tendon (Achilles tendon).  Elbow tendon (triceps tendon).  Tendons in the wrist. What are the causes? This condition may be caused by:  Overusing a tendon or muscle. This is common.  Age-related wear and tear.  Injury.  Inflammatory conditions, such as arthritis.  Certain medicines. What increases the risk? You are more likely to develop this condition if you do activities that involve the same movements over and over again (repetitive motions). What are the signs or symptoms? Symptoms of this condition may include:  Pain.  Tenderness.  Mild swelling.  Decreased range of motion. How is this diagnosed? This condition is diagnosed with a physical exam. You may also have tests, such as:  Ultrasound. This uses sound waves to make an image of the inside of your body in the affected area.  MRI. How is this treated? This condition may be treated by resting, icing, applying pressure (compression), and raising (elevating) the affected area above the level of your heart. This is known as RICE therapy. Treatment may also include:  Medicines to help reduce inflammation or to help reduce pain.  Exercises or physical therapy to strengthen and stretch the tendon.  A brace or splint.  Surgery. This is rarely needed. Follow these instructions at home: If you have a splint or brace:  Wear the splint or brace as told by your health care provider. Remove it only as told by your health care provider.  Loosen the splint or brace if your fingers or toes tingle, become numb, or turn cold and blue.  Keep the splint or brace clean.  If the splint or brace is not waterproof: ? Do not let it get wet. ? Cover it with a watertight covering when you take a bath  or shower. Managing pain, stiffness, and swelling  If directed, put ice on the affected area. ? If you have a removable splint or brace, remove it as told by your health care provider. ? Put ice in a plastic bag. ? Place a towel between your skin and the bag. ? Leave the ice on for 20 minutes, 2-3 times a day.  Move the fingers or toes of the affected limb often, if this applies. This can help to prevent stiffness and lessen swelling.  If directed, raise (elevate) the affected area above the level of your heart while you are sitting or lying down.  If directed, apply heat to the affected area before you exercise. Use the heat source that your health care provider recommends, such as a moist heat pack or a heating pad.     ? Place a towel between your skin and the heat source. ? Leave the heat on for 20-30 minutes. ? Remove the heat if your skin turns bright red. This is especially important if you are unable to feel pain, heat, or cold. You may have a greater risk of getting burned. Driving  Do not drive or use heavy machinery while taking prescription pain medicine.  Ask your health care provider when it is safe to drive if you have a splint or brace on any part of your arm or leg. Activity  Rest the affected area as told by your health care provider.  Return to your normal activities as told by your health care   provider. Ask your health care provider what activities are safe for you.  Avoid using the affected area while you are experiencing symptoms of tendinitis.  Do exercises as told by your health care provider. General instructions  If you have a splint, do not put pressure on any part of the splint until it is fully hardened. This may take several hours.  Wear an elastic bandage or compression wrap only as told by your health care provider.  Take over-the-counter and prescription medicines only as told by your health care provider.  Keep all follow-up visits as told  by your health care provider. This is important. Contact a health care provider if:  Your symptoms do not improve.  You develop new, unexplained problems, such as numbness in your hands. Summary  Tendinitis is inflammation of a tendon.  You are more likely to develop this condition if you do activities that involve the same movements over and over again.  This condition may be treated by resting, icing, applying pressure (compression), and elevating the area above the level of your heart. This is known as RICE therapy.  Avoid using the affected area while you are experiencing symptoms of tendinitis. This information is not intended to replace advice given to you by your health care provider. Make sure you discuss any questions you have with your health care provider. Document Revised: 08/29/2017 Document Reviewed: 07/12/2017 [employer] Patient Education  2020 [employer] Shoulder Exercises Ask your health care provider which exercises are safe for you. Do exercises exactly as told by your health care provider and adjust them as directed. It is normal to feel mild stretching, pulling, tightness, or discomfort as you do these exercises. Stop right away if you feel sudden pain or your pain gets worse. Do not begin these exercises until told by your health care provider. Stretching exercises External rotation and abduction This exercise is sometimes called corner stretch. This exercise rotates your arm outward (external rotation) and moves your arm out from your body (abduction). 1. Stand in a doorway with one of your feet slightly in front of the other. This is called a staggered stance. If you cannot reach your forearms to the door frame, stand facing a corner of a room. 2. Choose one of the following positions as told by your health care provider: ? Place your hands and forearms on the door frame above your head. ? Place your hands and forearms on the door frame at the height of your  head. ? Place your hands on the door frame at the height of your elbows. 3. Slowly move your weight onto your front foot until you feel a stretch across your chest and in the front of your shoulders. Keep your head and chest upright and keep your abdominal muscles tight. 4. Hold for _____15_____ seconds. 5. To release the stretch, shift your weight to your back foot. Repeat ____3______ times. Complete this exercise ____3______ times a day. Extension, standing 1. Stand and hold a broomstick, a cane, or a similar object behind your back. ? Your hands should be a little wider than shoulder width apart. ? Your palms should face away from your back. 2. Keeping your elbows straight and your shoulder muscles relaxed, move the stick away from your body until you feel a stretch in your shoulders (extension). ? Avoid shrugging your shoulders while you move the stick. Keep your shoulder blades tucked down toward the middle of your back. 3. Hold for __15_______ seconds. 4. Slowly return to  the starting position. Repeat ___3_______ times. Complete this exercise _____3_____ times a day. Range-of-motion exercises Pendulum  1. Stand near a wall or a surface that you can hold onto for balance. 2. Bend at the waist and let your left / right arm hang straight down. Use your other arm to support you. Keep your back straight and do not lock your knees. 3. Relax your left / right arm and shoulder muscles, and move your hips and your trunk so your left / right arm swings freely. Your arm should swing because of the motion of your body, not because you are using your arm or shoulder muscles. 4. Keep moving your hips and trunk so your arm swings in the following directions, as told by your health care provider: ? Side to side. ? Forward and backward. ? In clockwise and counterclockwise circles. 5. Continue each motion for ___15_______ seconds, or for as long as told by your health care provider. 6. Slowly return to  the starting position. Repeat ____3______ times. Complete this exercise _____3_____ times a day. Shoulder flexion, standing  1. Stand and hold a broomstick, a cane, or a similar object. Place your hands a little more than shoulder width apart on the object. Your left / right hand should be palm up, and your other hand should be palm down. 2. Keep your elbow straight and your shoulder muscles relaxed. Push the stick up with your healthy arm to raise your left / right arm in front of your body, and then over your head until you feel a stretch in your shoulder (flexion). ? Avoid shrugging your shoulder while you raise your arm. Keep your shoulder blade tucked down toward the middle of your back. 3. Hold for ___15_______ seconds. 4. Slowly return to the starting position. Repeat ___3_______ times. Complete this exercise _____3_____ times a day. Shoulder abduction, standing 1. Stand and hold a broomstick, a cane, or a similar object. Place your hands a little more than shoulder width apart on the object. Your left / right hand should be palm up, and your other hand should be palm down. 2. Keep your elbow straight and your shoulder muscles relaxed. Push the object across your body toward your left / right side. Raise your left / right arm to the side of your body (abduction) until you feel a stretch in your shoulder. ? Do not raise your arm above shoulder height unless your health care provider tells you to do that. ? If directed, raise your arm over your head. ? Avoid shrugging your shoulder while you raise your arm. Keep your shoulder blade tucked down toward the middle of your back. 3. Hold for _____15_____ seconds. 4. Slowly return to the starting position. Repeat ______3____ times. Complete this exercise ____3______ times a day. Internal rotation  1. Place your left / right hand behind your back, palm up. 2. Use your other hand to dangle an exercise band, a towel, or a similar object over your  shoulder. Grasp the band with your left / right hand so you are holding on to both ends. 3. Gently pull up on the band until you feel a stretch in the front of your left / right shoulder. The movement of your arm toward the center of your body is called internal rotation. ? Avoid shrugging your shoulder while you raise your arm. Keep your shoulder blade tucked down toward the middle of your back. 4. Hold for ____15______ seconds. 5. Release the stretch by letting go of the  band and lowering your hands. Repeat ____3______ times. Complete this exercise ______3____ times a day. Strengthening exercises External rotation  1. Sit in a stable chair without armrests. 2. Secure an exercise band to a stable object at elbow height on your left / right side. 3. Place a soft object, such as a folded towel or a small pillow, between your left / right upper arm and your body to move your elbow about 4 inches (10 cm) away from your side. 4. Hold the end of the exercise band so it is tight and there is no slack. 5. Keeping your elbow pressed against the soft object, slowly move your forearm out, away from your abdomen (external rotation). Keep your body steady so only your forearm moves. 6. Hold for ____15______ seconds. 7. Slowly return to the starting position. Repeat _____3_____ times. Complete this exercise _____3_____ times a day. Shoulder abduction  1. Sit in a stable chair without armrests, or stand up. 2. Hold a _____none_____ weight in your left / right hand, or hold an exercise band with both hands. 3. Start with your arms straight down and your left / right palm facing in, toward your body. 4. Slowly lift your left / right hand out to your side (abduction). Do not lift your hand above shoulder height unless your health care provider tells you that this is safe. ? Keep your arms straight. ? Avoid shrugging your shoulder while you do this movement. Keep your shoulder blade tucked down toward the  middle of your back. 5. Hold for _____15_____ seconds. 6. Slowly lower your arm, and return to the starting position. Repeat _____3_____ times. Complete this exercise _____3_____ times a day. Shoulder extension 1. Sit in a stable chair without armrests, or stand up. 2. Secure an exercise band to a stable object in front of you so it is at shoulder height. 3. Hold one end of the exercise band in each hand. Your palms should face each other. 4. Straighten your elbows and lift your hands up to shoulder height. 5. Step back, away from the secured end of the exercise band, until the band is tight and there is no slack. 6. Squeeze your shoulder blades together as you pull your hands down to the sides of your thighs (extension). Stop when your hands are straight down by your sides. Do not let your hands go behind your body. 7. Hold for ___15_______ seconds. 8. Slowly return to the starting position. Repeat ______3____ times. Complete this exercise ____3______ times a day. Shoulder row 1. Sit in a stable chair without armrests, or stand up. 2. Secure an exercise band to a stable object in front of you so it is at waist height. 3. Hold one end of the exercise band in each hand. Position your palms so that your thumbs are facing the ceiling (neutral position). 4. Bend each of your elbows to a 90-degree angle (right angle) and keep your upper arms at your sides. 5. Step back until the band is tight and there is no slack. 6. Slowly pull your elbows back behind you. 7. Hold for ____15______ seconds. 8. Slowly return to the starting position. Repeat _____3_____ times. Complete this exercise _____3_____ times a day. Shoulder press-ups  1. Sit in a stable chair that has armrests. Sit upright, with your feet flat on the floor. 2. Put your hands on the armrests so your elbows are bent and your fingers are pointing forward. Your hands should be about even with the sides of your  body. 3. Push down on the  armrests and use your arms to lift yourself off the chair. Straighten your elbows and lift yourself up as much as you comfortably can. ? Move your shoulder blades down, and avoid letting your shoulders move up toward your ears. ? Keep your feet on the ground. As you get stronger, your feet should support less of your body weight as you lift yourself up. 4. Hold for ___15_______ seconds. 5. Slowly lower yourself back into the chair. Repeat ___3_______ times. Complete this exercise ___3______ times a day. Wall push-ups  1. Stand so you are facing a stable wall. Your feet should be about one arm-length away from the wall. 2. Lean forward and place your palms on the wall at shoulder height. 3. Keep your feet flat on the floor as you bend your elbows and lean forward toward the wall. 4. Hold for ____15______ seconds. 5. Straighten your elbows to push yourself back to the starting position. Repeat ____3______ times. Complete this exercise ____3______ times a day. This information is not intended to replace advice given to you by your health care provider. Make sure you discuss any questions you have with your health care provider. Document Revised: 06/15/2018 Document Reviewed: 03/23/2018 [employer] Patient Education  2020 [employer] Cervical Strain and Sprain Rehab Ask your health care provider which exercises are safe for you. Do exercises exactly as told by your health care provider and adjust them as directed. It is normal to feel mild stretching, pulling, tightness, or discomfort as you do these exercises. Stop right away if you feel sudden pain or your pain gets worse. Do not begin these exercises until told by your health care provider. Stretching and range-of-motion exercises Cervical side bending  6. Using good posture, sit on a stable chair or stand up. 7. Without moving your shoulders, slowly tilt your left / right ear to your shoulder until you feel a stretch in the opposite side neck  muscles. You should be looking straight ahead. 8. Hold for ____15______ seconds. 9. Repeat with the other side of your neck. Repeat _____3_____ times. Complete this exercise _____3_____ times a day. Cervical rotation  5. Using good posture, sit on a stable chair or stand up. 6. Slowly turn your head to the side as if you are looking over your left / right shoulder. ? Keep your eyes level with the ground. ? Stop when you feel a stretch along the side and the back of your neck. 7. Hold for __15________ seconds. 8. Repeat this by turning to your other side. Repeat _____3_____ times. Complete this exercise ____3______ times a day. Thoracic extension and pectoral stretch 7. Roll a towel or a small blanket so it is about 4 inches (10 cm) in diameter. 8. Lie down on your back on a firm surface. 9. Put the towel lengthwise, under your spine in the middle of your back. It should not be under your shoulder blades. The towel should line up with your spine from your middle back to your lower back. 10. Put your hands behind your head and let your elbows fall out to your sides. 11. Hold for ____15______ seconds. Repeat ____3______ times. Complete this exercise ____3______ times a day. Strengthening exercises Isometric upper cervical flexion 5. Lie on your back with a thin pillow behind your head and a small rolled-up towel under your neck. 6. Gently tuck your chin toward your chest and nod your head down to look toward your feet. Do not lift your  head off the pillow. 7. Hold for ___15_______ seconds. 8. Release the tension slowly. Relax your neck muscles completely before you repeat this exercise. Repeat _____3_____ times. Complete this exercise ____3______ times a day. Isometric cervical extension  5. Stand about 6 inches (15 cm) away from a wall, with your back facing the wall. 6. Place a soft object, about 6-8 inches (15-20 cm) in diameter, between the back of your head and the wall. A soft object  could be a small pillow, a ball, or a folded towel. 7. Gently tilt your head back and press into the soft object. Keep your jaw and forehead relaxed. 8. Hold for ____15______ seconds. 9. Release the tension slowly. Relax your neck muscles completely before you repeat this exercise. Repeat _____3_____ times. Complete this exercise ____3______ times a day. Posture and body mechanics Body mechanics refers to the movements and positions of your body while you do your daily activities. Posture is part of body mechanics. Good posture and healthy body mechanics can help to relieve stress in your body's tissues and joints. Good posture means that your spine is in its natural S-curve position (your spine is neutral), your shoulders are pulled back slightly, and your head is not tipped forward. The following are general guidelines for applying improved posture and body mechanics to your everyday activities. Sitting  6. When sitting, keep your spine neutral and keep your feet flat on the floor. Use a footrest, if necessary, and keep your thighs parallel to the floor. Avoid rounding your shoulders, and avoid tilting your head forward. 7. When working at a desk or a computer, keep your desk at a height where your hands are slightly lower than your elbows. Slide your chair under your desk so you are close enough to maintain good posture. 8. When working at a computer, place your monitor at a height where you are looking straight ahead and you do not have to tilt your head forward or downward to look at the screen. Standing   When standing, keep your spine neutral and keep your feet about hip-width apart. Keep a slight bend in your knees. Your ears, shoulders, and hips should line up.  When you do a task in which you stand in one place for a long time, place one foot up on a stable object that is 2-4 inches (5-10 cm) high, such as a footstool. This helps keep your spine neutral. Resting When lying down and  resting, avoid positions that are most painful for you. Try to support your neck in a neutral position. You can use a contour pillow or a small rolled-up towel. Your pillow should support your neck but not push on it. This information is not intended to replace advice given to you by your health care provider. Make sure you discuss any questions you have with your health care provider. Document Revised: 06/13/2018 Document Reviewed: 11/22/2017 [employer] Patient Education  2020 [employer] Cervical Sprain  A cervical sprain is a stretch or tear in one or more of the tough, cord-like tissues that connect bones (ligaments) in the neck. Cervical sprains can range from mild to severe. Severe cervical sprains can cause the spinal bones (vertebrae) in the neck to be unstable. This can lead to spinal cord damage and can result in serious nervous system problems. The amount of time that it takes for a cervical sprain to get better depends on the cause and extent of the injury. Most cervical sprains heal in 4-6 weeks. What are  the causes? Cervical sprains may be caused by an injury (trauma), such as from a motor vehicle accident, a fall, or sudden forward and backward whipping movement of the head and neck (whiplash injury). Mild cervical sprains may be caused by wear and tear over time, such as from poor posture, sitting in a chair that does not provide support, or looking up or down for long periods of time. What increases the risk? The following factors may make you more likely to develop this condition:  Participating in activities that have a high risk of trauma to the neck. These include contact sports, auto racing, gymnastics, and diving.  Taking risks when driving or riding in a motor vehicle, such as speeding.  Having osteoarthritis of the spine.  Having poor strength and flexibility of the neck.  A previous neck injury.  Having poor posture.  Spending a lot of time in certain positions  that put stress on the neck, such as sitting at a computer for long periods of time. What are the signs or symptoms? Symptoms of this condition include:  Pain, soreness, stiffness, tenderness, swelling, or a burning sensation in the front, back, or sides of the neck.  Sudden tightening of neck muscles that you cannot control (muscle spasms).  Pain in the shoulders or upper back.  Limited ability to move the neck.  Headache.  Dizziness.  Nausea.  Vomiting.  Weakness, numbness, or tingling in a hand or an arm. Symptoms may develop right away after injury, or they may develop over a few days. In some cases, symptoms may go away with treatment and return (recur) over time. How is this diagnosed? This condition may be diagnosed based on:  Your medical history.  Your symptoms.  Any recent injuries or known neck problems that you have, such as arthritis in the neck.  A physical exam.  Imaging tests, such as: ? X-rays. ? MRI. ? CT scan. How is this treated? This condition is treated by resting and icing the injured area and doing physical therapy exercises. Depending on the severity of your condition, treatment may also include:  Keeping your neck in place (immobilized) for periods of time. This may be done using: ? A cervical collar. This supports your chin and the back of your head. ? A cervical traction device. This is a sling that holds up your head. This removes weight and pressure from your neck, and it may help to relieve pain.  Medicines that help to relieve pain and inflammation.  Medicines that help to relax your muscles (muscle relaxants).  Surgery. This is rare. Follow these instructions at home: If you have a cervical collar:   Wear it as told by your health care provider. Do not remove the collar unless instructed by your health care provider.  Ask your health care provider before you make any adjustments to your collar.  If you have long hair, keep it  outside of the collar.  Ask your health care provider if you can remove the collar for cleaning and bathing. If you are allowed to remove the collar for cleaning or bathing: ? Follow instructions from your health care provider about how to remove the collar safely. ? Clean the collar by wiping it with mild soap and water and drying it completely. ? If your collar has removable pads, remove them every 1-2 days and wash them by hand with soap and water. Let them air-dry completely before you put them back in the collar. ? Check your  skin under the collar for irritation or sores. If you see any, tell your health care provider. Managing pain, stiffness, and swelling   If directed, use a cervical traction device as told by your health care provider.  If directed, apply heat to the affected area before you do your physical therapy or as often as told by your health care provider. Use the heat source that your health care provider recommends, such as a moist heat pack or a heating pad. ? Place a towel between your skin and the heat source. ? Leave the heat on for 20-30 minutes. ? Remove the heat if your skin turns bright red. This is especially important if you are unable to feel pain, heat, or cold. You may have a greater risk of getting burned.  If directed, put ice on the affected area: ? Put ice in a plastic bag. ? Place a towel between your skin and the bag. ? Leave the ice on for 20 minutes, 2-3 times a day. Activity  Do not drive while wearing a cervical collar. If you do not have a cervical collar, ask your health care provider if it is safe to drive while your neck heals.  Do not drive or use heavy machinery while taking prescription pain medicine or muscle relaxants, unless your health care provider approves.  Do not lift anything that is heavier than 10 lb (4.5 kg) until your health care provider tells you that it is safe.  Rest as directed by your health care provider. Avoid  positions and activities that make your symptoms worse. Ask your health care provider what activities are safe for you.  If physical therapy was prescribed, do exercises as told by your health care provider or physical therapist. General instructions  Take over-the-counter and prescription medicines only as told by your health care provider.  Do not use any products that contain nicotine or tobacco, such as cigarettes and e-cigarettes. These can delay healing. If you need help quitting, ask your health care provider.  Keep all follow-up visits as told by your health care provider or physical therapist. This is important. How is this prevented? To prevent a cervical sprain from happening again:  Use and maintain good posture. Make any needed adjustments to your workstation to help you use good posture.  Exercise regularly as directed by your health care provider or physical therapist.  Avoid risky activities that may cause a cervical sprain. Contact a health care provider if:  You have symptoms that get worse or do not get better after 2 weeks of treatment.  You have pain that gets worse or does not get better with medicine.  You develop new, unexplained symptoms.  You have sores or irritated skin on your neck from wearing your cervical collar. Get help right away if:  You have severe pain.  You develop numbness, tingling, or weakness in any part of your body.  You cannot move a part of your body (you have paralysis).  You have neck pain along with: ? Severe dizziness. ? Headache. Summary  A cervical sprain is a stretch or tear in one or more of the tough, cord-like tissues that connect bones (ligaments) in the neck.  Cervical sprains may be caused by an injury (trauma), such as from a motor vehicle accident, a fall, or sudden forward and backward whipping movement of the head and neck (whiplash injury).  Symptoms may develop right away after injury, or they may develop  over a few days.  This condition is treated by resting and icing the injured area and doing physical therapy exercises. This information is not intended to replace advice given to you by your health care provider. Make sure you discuss any questions you have with your health care provider. Document Revised: 06/13/2018 Document Reviewed: 10/21/2015 Elsevier Patient Education  2020 [employer].

## 2019-09-19 ENCOUNTER — Encounter: Payer: Self-pay | Admitting: Registered Nurse

## 2019-09-19 ENCOUNTER — Ambulatory Visit: Payer: Self-pay | Admitting: Registered Nurse

## 2019-09-19 ENCOUNTER — Other Ambulatory Visit: Payer: Self-pay

## 2019-09-19 VITALS — BP 130/90 | HR 83 | Temp 98.7°F

## 2019-09-19 DIAGNOSIS — J019 Acute sinusitis, unspecified: Secondary | ICD-10-CM

## 2019-09-19 DIAGNOSIS — J029 Acute pharyngitis, unspecified: Secondary | ICD-10-CM

## 2019-09-19 MED ORDER — FLUCONAZOLE 150 MG PO TABS: ORAL_TABLET | ORAL | 0 refills | Status: DC

## 2019-09-19 MED ORDER — AMOXICILLIN-POT CLAVULANATE 875-125 MG PO TABS: 1.0000 | ORAL_TABLET | Freq: Two times a day (BID) | ORAL | 0 refills | Status: AC

## 2019-09-19 NOTE — Patient Instructions (Signed)
Throat Cancer  Throat cancer (oropharyngeal cancer) is an abnormal growth of cancerous cells (malignant tumors) in the throat. Tumors may affect the tonsils, the back of the throat (pharynx), or the voice box (larynx). What are the causes? The exact cause of throat cancer is not known. What increases the risk? You are more likely to develop throat cancer if you:  Use any tobacco products, including cigarettes, chewing tobacco, and e-cigarettes.  Have a history of HPV (human papillomavirus).  Drink alcohol excessively.  Have combined lifestyle habits of alcohol and tobacco use.  Eat a diet that is low in fruits and vegetables.  Are African American.  Are female.  Are older than age 37.  Have been exposed to certain chemicals such as nickel, asbestos, or sulfuric acid fumes. What are the signs or symptoms? Symptoms may include:  Sore throat.  A lump, growth, or sore in the throat that does not get better.  Trouble swallowing.  Ear pain.  Cough.  Weight loss without trying (unintentional weight loss).  Hoarse voice that does not go away. How is this diagnosed? This condition may be diagnosed based on:  Your symptoms and medical history.  A physical exam of the throat. A tube with a light and a camera on the end of it (endoscope or laryngoscope) may be used to examine your throat.  Imaging tests, such as: ? CT scan of the throat. ? PET scan. ? MRI. ? X-rays of the chest and mouth.  Removal of a sample of tumor tissue to be tested (biopsy).  Tests to determine how well you are able to swallow, such as: ? Barium swallow. This is a procedure in which you swallow a solution (barium) before X-rays are done to evaluate your throat and other structures. The barium shows up well on X-rays, making it easier for your health care provider to see possible problems. ? Laryngeal videostroboscopy. During this exam, your health care provider uses an endoscope to take a video of  your vocal cords in motion. ? Fiberoptic endoscopic examination (FEES): During this procedure, a small, flexible endoscope is inserted through your nose to examine your ability to swallow. Your cancer will be assessed (staged) to determine how severe it is and how much it has spread (metastasized). How is this treated? Treatment for throat cancer depends on the type and stage of the cancer. Treatment may include one or more of the following:  Surgery to remove as much of the cancer as possible. This surgery may also involve removing lymph nodes in the area to be checked for cancer cells.  Medicines that kill cancer cells (chemotherapy).  High-energy rays that kill cancer cells (radiation therapy).  Targeted therapy. This targets specific parts of cancer cells and the area around them to block the growth and the spread of the cancer. Targeted therapy can help to limit the damage to healthy cells.  Medicines that help your body's disease-fighting system (immune system) fight cancer cells (immunotherapy). Follow these instructions at home: Lifestyle  Do not drink alcohol.  Do not use any products that contain nicotine or tobacco, such as cigarettes and e-cigarettes. If you need help quitting, ask your health care provider. Eating and drinking  Some of your treatments might affect your appetite and your ability to chew and swallow. If you are having problems eating, or if you do not have an appetite, meet with a diet and nutrition specialist (dietitian).  If you have side effects that affect eating, it may help to: ?  Eat smaller meals and snacks often. ? Drink high-nutrition and high-calorie shakes or supplements. ? Eat bland and soft foods that are easy to eat. ? Not eat foods that are hot, spicy, or hard to swallow. General instructions  Take over-the-counter and prescription medicines only as told by your health care provider.  Consider joining a support group for people who have  been diagnosed with throat cancer.  Work with your health care provider to manage any side effects of treatment.  Keep all follow-up visits as told by your health care provider. This is important. Where to find more information  American Cancer Society: www.cancer.org  [employer] ([employer]CI): www.cancer.gov Contact a health care provider if:  You have a fever.  You continue to lose weight without trying.  You have more problems chewing or swallowing.  You have new fatigue or weakness.  You have new symptoms, or your symptoms get worse. Get help right away if:  You cough up blood.  You have trouble breathing.  You faint.  You have pain that suddenly gets worse. Summary  Throat cancer (oropharyngeal cancer) is an abnormal growth of cancerous cells (malignant tumors) in the throat.  Tumors may affect the tonsils, the back of the throat (pharynx), or the voice box (larynx).  Your cancer will be assessed (staged) to determine how severe it is and how much it has spread (metastasized).  Work with your health care provider to manage any side effects of treatment. This information is not intended to replace advice given to you by your health care provider. Make sure you discuss any questions you have with your health care provider. Document Revised: 06/14/2018 Document Reviewed: 12/05/2016 Elsevier Patient Education  2020 [employer]  Pharyngitis  Pharyngitis is redness, pain, and swelling (inflammation) of the throat (pharynx). It is a very common cause of sore throat. Pharyngitis can be caused by a bacteria, but it is usually caused by a virus. Most cases of pharyngitis get better on their own without treatment. What are the causes? This condition may be caused by:  Infection by viruses (viral). Viral pharyngitis spreads from person to person (is contagious) through coughing, sneezing, and sharing of personal items or utensils such as cups, forks, spoons, and  toothbrushes.  Infection by bacteria (bacterial). Bacterial pharyngitis may be spread by touching the nose or face after coming in contact with the bacteria, or through more intimate contact, such as kissing.  Allergies. Allergies can cause buildup of mucus in the throat (post-nasal drip), leading to inflammation and irritation. Allergies can also cause blocked nasal passages, forcing breathing through the mouth, which dries and irritates the throat. What increases the risk? You are more likely to develop this condition if:  You are 59-80 years old.  You are exposed to crowded environments such as daycare, school, or dormitory living.  You live in a cold climate.  You have a weakened disease-fighting (immune) system. What are the signs or symptoms? Symptoms of this condition vary by the cause (viral, bacterial, or allergies) and can include:  Sore throat.  Fatigue.  Low-grade fever.  Headache.  Joint pain and muscle aches.  Skin rashes.  Swollen glands in the throat (lymph nodes).  Plaque-like film on the throat or tonsils. This is often a symptom of bacterial pharyngitis.  Vomiting.  Stuffy nose (nasal congestion).  Cough.  Red, itchy eyes (conjunctivitis).  Loss of appetite. How is this diagnosed? This condition is often diagnosed based on your medical history and a physical  exam. Your health care provider will ask you questions about your illness and your symptoms. A swab of your throat may be done to check for bacteria (rapid strep test). Other lab tests may also be done, depending on the suspected cause, but these are rare. How is this treated? This condition usually gets better in 3-4 days without medicine. Bacterial pharyngitis may be treated with antibiotic medicines. Follow these instructions at home:  Take over-the-counter and prescription medicines only as told by your health care provider. ? If you were prescribed an antibiotic medicine, take it as told by  your health care provider. Do not stop taking the antibiotic even if you start to feel better. ? Do not give children aspirin because of the association with Reye syndrome.  Drink enough water and fluids to keep your urine clear or pale yellow.  Get a lot of rest.  Gargle with a salt-water mixture 3-4 times a day or as needed. To make a salt-water mixture, completely dissolve -1 tsp of salt in 1 cup of warm water.  If your health care provider approves, you may use throat lozenges or sprays to soothe your throat. Contact a health care provider if:  You have large, tender lumps in your neck.  You have a rash.  You cough up green, yellow-brown, or bloody spit. Get help right away if:  Your neck becomes stiff.  You drool or are unable to swallow liquids.  You cannot drink or take medicines without vomiting.  You have severe pain that does not go away, even after you take medicine.  You have trouble breathing, and it is not caused by a stuffy nose.  You have new pain and swelling in your joints such as the knees, ankles, wrists, or elbows. Summary  Pharyngitis is redness, pain, and swelling (inflammation) of the throat (pharynx).  While pharyngitis can be caused by a bacteria, the most common causes are viral.  Most cases of pharyngitis get better on their own without treatment.  Bacterial pharyngitis is treated with antibiotic medicines. This information is not intended to replace advice given to you by your health care provider. Make sure you discuss any questions you have with your health care provider. Document Revised: 02/03/2017 Document Reviewed: 03/29/2016 Elsevier Patient Education  2020 [employer] Inc[employer]  Sinusitis, Adult Sinusitis is inflammation of your sinuses. Sinuses are hollow spaces in the bones around your face. Your sinuses are located:  Around your eyes.  In the middle of your forehead.  Behind your nose.  In your cheekbones. Mucus normally drains  out of your sinuses. When your nasal tissues become inflamed or swollen, mucus can become trapped or blocked. This allows bacteria, viruses, and fungi to grow, which leads to infection. Most infections of the sinuses are caused by a virus. Sinusitis can develop quickly. It can last for up to 4 weeks (acute) or for more than 12 weeks (chronic). Sinusitis often develops after a cold. What are the causes? This condition is caused by anything that creates swelling in the sinuses or stops mucus from draining. This includes:  Allergies.  Asthma.  Infection from bacteria or viruses.  Deformities or blockages in your nose or sinuses.  Abnormal growths in the nose (nasal polyps).  Pollutants, such as chemicals or irritants in the air.  Infection from fungi (rare). What increases the risk? You are more likely to develop this condition if you:  Have a weak body defense system (immune system).  Do a lot of swimming or  diving.  Overuse nasal sprays.  Smoke. What are the signs or symptoms? The main symptoms of this condition are pain and a feeling of pressure around the affected sinuses. Other symptoms include:  Stuffy nose or congestion.  Thick drainage from your nose.  Swelling and warmth over the affected sinuses.  Headache.  Upper toothache.  A cough that may get worse at night.  Extra mucus that collects in the throat or the back of the nose (postnasal drip).  Decreased sense of smell and taste.  Fatigue.  A fever.  Sore throat.  Bad breath. How is this diagnosed? This condition is diagnosed based on:  Your symptoms.  Your medical history.  A physical exam.  Tests to find out if your condition is acute or chronic. This may include: ? Checking your nose for nasal polyps. ? Viewing your sinuses using a device that has a light (endoscope). ? Testing for allergies or bacteria. ? Imaging tests, such as an MRI or CT scan. In rare cases, a bone biopsy may be done  to rule out more serious types of fungal sinus disease. How is this treated? Treatment for sinusitis depends on the cause and whether your condition is chronic or acute.  If caused by a virus, your symptoms should go away on their own within 10 days. You may be given medicines to relieve symptoms. They include: ? Medicines that shrink swollen nasal passages (topical intranasal decongestants). ? Medicines that treat allergies (antihistamines). ? A spray that eases inflammation of the nostrils (topical intranasal corticosteroids). ? Rinses that help get rid of thick mucus in your nose (nasal saline washes).  If caused by bacteria, your health care provider may recommend waiting to see if your symptoms improve. Most bacterial infections will get better without antibiotic medicine. You may be given antibiotics if you have: ? A severe infection. ? A weak immune system.  If caused by narrow nasal passages or nasal polyps, you may need to have surgery. Follow these instructions at home: Medicines  Take, use, or apply over-the-counter and prescription medicines only as told by your health care provider. These may include nasal sprays.  If you were prescribed an antibiotic medicine, take it as told by your health care provider. Do not stop taking the antibiotic even if you start to feel better. Hydrate and humidify   Drink enough fluid to keep your urine pale yellow. Staying hydrated will help to thin your mucus.  Use a cool mist humidifier to keep the humidity level in your home above 50%.  Inhale steam for 10-15 minutes, 3-4 times a day, or as told by your health care provider. You can do this in the bathroom while a hot shower is running.  Limit your exposure to cool or dry air. Rest  Rest as much as possible.  Sleep with your head raised (elevated).  Make sure you get enough sleep each night. General instructions   Apply a warm, moist washcloth to your face 3-4 times a day or as  told by your health care provider. This will help with discomfort.  Wash your hands often with soap and water to reduce your exposure to germs. If soap and water are not available, use hand sanitizer.  Do not smoke. Avoid being around people who are smoking (secondhand smoke).  Keep all follow-up visits as told by your health care provider. This is important. Contact a health care provider if:  You have a fever.  Your symptoms get worse.  Your  symptoms do not improve within 10 days. Get help right away if:  You have a severe headache.  You have persistent vomiting.  You have severe pain or swelling around your face or eyes.  You have vision problems.  You develop confusion.  Your neck is stiff.  You have trouble breathing. Summary  Sinusitis is soreness and inflammation of your sinuses. Sinuses are hollow spaces in the bones around your face.  This condition is caused by nasal tissues that become inflamed or swollen. The swelling traps or blocks the flow of mucus. This allows bacteria, viruses, and fungi to grow, which leads to infection.  If you were prescribed an antibiotic medicine, take it as told by your health care provider. Do not stop taking the antibiotic even if you start to feel better.  Keep all follow-up visits as told by your health care provider. This is important. This information is not intended to replace advice given to you by your health care provider. Make sure you discuss any questions you have with your health care provider. Document Revised: 07/24/2017 Document Reviewed: 07/24/2017 Elsevier Patient Education  2020 [employer].  How to Perform a Sinus Rinse A sinus rinse is a home treatment that is used to rinse your sinuses with a sterile mixture of salt and water (saline solution). Sinuses are air-filled spaces in your skull behind the bones of your face and forehead that open into your nasal cavity. A sinus rinse can help to clear mucus, dirt,  dust, or pollen from your nasal cavity. You may do a sinus rinse when you have a cold, a virus, nasal allergy symptoms, a sinus infection, or stuffiness in your nose or sinuses. Talk with your health care provider about whether a sinus rinse might help you. What are the risks? A sinus rinse is generally safe and effective. However, there are a few risks, which include: A burning sensation in your sinuses. This may happen if you do not make the saline solution as directed. Be sure to follow all directions when making the saline solution. Nasal irritation. Infection from contaminated water. This is rare, but possible. Do not do a sinus rinse if you have had ear or nasal surgery, ear infection, or blocked ears. Supplies needed: Saline solution or powder. Distilled or sterile water may be needed to mix with saline powder. You may use boiled and cooled tap water. Boil tap water for 5 minutes; cool until it is lukewarm. Use within 24 hours. Do not use regular tap water to mix with the saline solution. Neti pot or nasal rinse bottle. These supplies release the saline solution into your nose and through your sinuses. Neti pots and nasal rinse bottles can be purchased at [employer], a health food store, or online. How to perform a sinus rinse  Wash your hands with soap and water. Wash your device according to the directions that came with the product and then dry it. Use the solution that comes with your product or one that is sold separately in stores. Follow the mixing directions on the package if you need to mix with sterile or distilled water. Fill the device with the amount of saline solution noted in the device instructions. Stand over a sink and tilt your head sideways over the sink. Place the spout of the device in your upper nostril (the one closer to the ceiling). Gently pour or squeeze the saline solution into your nasal cavity. The liquid should drain out from the lower nostril  if  you are not too congested. While rinsing, breathe through your open mouth. Gently blow your nose to clear any mucus and rinse solution. Blowing too hard may cause ear pain. Repeat in your other nostril. Clean and rinse your device with clean water and then air-dry it. Talk with your health care provider or pharmacist if you have questions about how to do a sinus rinse. Summary A sinus rinse is a home treatment that is used to rinse your sinuses with a sterile mixture of salt and water (saline solution). A sinus rinse is generally safe and effective. Follow all instructions carefully. Before doing a sinus rinse, talk with your health care provider about whether it would be helpful for you. This information is not intended to replace advice given to you by your health care provider. Make sure you discuss any questions you have with your health care provider. Document Revised: 12/19/2016 Document Reviewed: 12/19/2016 Elsevier Patient Education  2020 [employer].

## 2019-09-19 NOTE — Progress Notes (Signed)
Subjective:    Patient ID: Sydney Spencer, female    DOB: Jul 24, 1975, 45 y.o.   MRN: [id number]  44y/o Caucasian established female pt c/o maxillary and frontal sinus pain/pressure, drainage, PND with sore throat, bilateral otalgia x1 week. Also with productive cough with clear phlegm. Using phenylephrine, nasal saline at home with some relief.   Patient reported she sometimes gets lesions back of throat that have drainage and then resolve.  She does smoke cigarettes.  Last augmentin Nov 2020 typically needs diflucan with antibiotics for yeast infections vaginal post oral antibiotics.  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  Has been monitoring temperature denied fever in past week.  Denied known exposure to anyone with known covid.     Review of Systems  Constitutional: Negative for activity change, appetite change, chills, diaphoresis, fatigue, fever and unexpected weight change.  HENT: Positive for congestion, ear pain, mouth sores, postnasal drip, rhinorrhea, sinus pressure, sinus pain, sneezing and sore throat. Negative for dental problem, drooling, ear discharge, facial swelling, hearing loss, nosebleeds, trouble swallowing and voice change.   Eyes: Negative for photophobia, pain, discharge, redness, itching and visual disturbance.  Respiratory: Positive for cough. Negative for choking, chest tightness, shortness of breath, wheezing and stridor.   Cardiovascular: Negative for chest pain and palpitations.  Gastrointestinal: Negative for abdominal pain, diarrhea, nausea and vomiting.  Endocrine: Negative for cold intolerance and heat intolerance.  Genitourinary: Negative for difficulty urinating.  Musculoskeletal: Negative for arthralgias, back pain, gait problem, joint swelling, myalgias, neck pain and neck stiffness.  Skin: Negative for rash.  Allergic/Immunologic: Positive for environmental  allergies. Negative for food allergies.  Neurological: Negative for dizziness, tremors, seizures, syncope, facial asymmetry, speech difficulty, weakness, light-headedness, numbness and headaches.  Hematological: Negative for adenopathy. Does not bruise/bleed easily.  Psychiatric/Behavioral: Negative for agitation, confusion and sleep disturbance.       Objective:   Physical Exam Vitals and nursing note reviewed.  Constitutional:      General: She is awake. She is not in acute distress.    Appearance: Normal appearance. She is well-developed, well-groomed and normal weight. She is not ill-appearing, toxic-appearing or diaphoretic.  HENT:     Head: Normocephalic and atraumatic. No raccoon eyes, Battle's sign, abrasion, contusion, masses, right periorbital erythema, left periorbital erythema or laceration. Hair is normal.     Jaw: There is normal jaw occlusion. No trismus, tenderness, swelling, pain on movement or malocclusion.     Salivary Glands: Right salivary gland is not diffusely enlarged or tender. Left salivary gland is not diffusely enlarged or tender.     Right Ear: Hearing, ear canal and external ear normal. No decreased hearing noted. No laceration, drainage, swelling or tenderness. A middle ear effusion is present. There is no impacted cerumen. No mastoid tenderness. No PE tube. No hemotympanum. Tympanic membrane is scarred and bulging. Tympanic membrane is not perforated or erythematous.     Left Ear: Hearing, ear canal and external ear normal. No decreased hearing noted. Tenderness present. No laceration, drainage or swelling. A middle ear effusion is present. There is no impacted cerumen. No mastoid tenderness. No PE tube. No hemotympanum. Tympanic membrane is scarred and bulging. Tympanic membrane is not perforated or erythematous.     Nose: Mucosal edema, congestion and rhinorrhea present. No nasal deformity, septal deviation or laceration.     Right Turbinates: Not enlarged,  swollen or pale.  Left Turbinates: Not enlarged, swollen or pale.     Right Sinus: Maxillary sinus tenderness and frontal sinus tenderness present.     Left Sinus: Maxillary sinus tenderness and frontal sinus tenderness present.     Mouth/Throat:     Mouth: Mucous membranes are moist. Mucous membranes are not pale, not dry and not cyanotic. Oral lesions present. No injury, lacerations or angioedema.     Dentition: No dental tenderness, gingival swelling, dental caries, dental abscesses or gum lesions.     Tongue: No lesions. Tongue does not deviate from midline.     Palate: No mass and lesions.     Pharynx: Uvula midline. Pharyngeal swelling and posterior oropharyngeal erythema present. No oropharyngeal exudate or uvula swelling.     Tonsils: No tonsillar exudate or tonsillar abscesses. 1+ on the right. 1+ on the left.      Comments: Cobblestoning posterior pharynx; bilateral allergic shiners; nasal sniffing noted in exam room/congestion nasal; bilateral allergic shiners; clear discharge bilateral nasal turbinates edema/erythema Eyes:     General: Lids are normal. Vision grossly intact. Gaze aligned appropriately. Allergic shiner present. No visual field deficit or scleral icterus.       Right eye: No foreign body, discharge or hordeolum.        Left eye: No foreign body, discharge or hordeolum.     Extraocular Movements: Extraocular movements intact.     Right eye: Normal extraocular motion and no nystagmus.     Left eye: Normal extraocular motion and no nystagmus.     Conjunctiva/sclera: Conjunctivae normal.     Right eye: Right conjunctiva is not injected. No chemosis, exudate or hemorrhage.    Left eye: Left conjunctiva is not injected. No chemosis, exudate or hemorrhage.    Pupils: Pupils are equal, round, and reactive to light. Pupils are equal.     Right eye: Pupil is round and reactive.     Left eye: Pupil is round and reactive.  Neck:     Thyroid: No thyroid mass, thyromegaly  or thyroid tenderness.     Vascular: No carotid bruit.     Trachea: Trachea normal. No tracheal tenderness or tracheal deviation.  Cardiovascular:     Rate and Rhythm: Normal rate and regular rhythm.     Chest Wall: PMI is not displaced.     Pulses: Normal pulses.          Radial pulses are 2+ on the right side and 2+ on the left side.     Heart sounds: Normal heart sounds, S1 normal and S2 normal. No murmur heard.  No friction rub. No gallop.   Pulmonary:     Effort: Pulmonary effort is normal. No accessory muscle usage or respiratory distress.     Breath sounds: Normal breath sounds and air entry. No stridor, decreased air movement or transmitted upper airway sounds. No decreased breath sounds, wheezing, rhonchi or rales.     Comments: Wearing buff cloth due to covid pandemic; spoke full sentences without difficulty; no cough observed in exam room Chest:     Chest wall: No tenderness.  Abdominal:     General: Abdomen is flat. There is no distension.     Palpations: Abdomen is soft.  Musculoskeletal:        General: No swelling, tenderness, deformity or signs of injury. Normal range of motion.     Right shoulder: Normal.     Left shoulder: Normal.     Right elbow: Normal.     Left elbow:  Normal.     Right hand: Normal.     Left hand: Normal.     Cervical back: Normal, normal range of motion and neck supple. No swelling, edema, deformity, erythema, signs of trauma, lacerations, rigidity, spasms, torticollis, tenderness or crepitus. No pain with movement or muscular tenderness. Normal range of motion.     Thoracic back: Normal.     Lumbar back: Normal.     Right hip: Normal.     Left hip: Normal.     Right knee: Normal.     Left knee: Normal.     Right lower leg: No edema.     Left lower leg: No edema.  Lymphadenopathy:     Head:     Right side of head: No submental, submandibular, tonsillar, preauricular, posterior auricular or occipital adenopathy.     Left side of head: No  submental, submandibular, tonsillar, preauricular, posterior auricular or occipital adenopathy.     Cervical: No cervical adenopathy.     Right cervical: No superficial, deep or posterior cervical adenopathy.    Left cervical: No superficial, deep or posterior cervical adenopathy.     Comments: Slightly tender anterior cervical lymph nodes but no discrete nodules or shotty nodes palpated or visualized  Skin:    General: Skin is warm and dry.     Capillary Refill: Capillary refill takes less than 2 seconds.     Coloration: Skin is not ashen, cyanotic, jaundiced, mottled, pale or sallow.     Findings: No abrasion, abscess, acne, bruising, burn, ecchymosis, erythema, signs of injury, laceration, lesion, petechiae, rash or wound.     Nails: There is no clubbing.  Neurological:     General: No focal deficit present.     Mental Status: She is alert and oriented to person, place, and time. Mental status is at baseline. She is not disoriented.     GCS: GCS eye subscore is 4. GCS verbal subscore is 5. GCS motor subscore is 6.     Cranial Nerves: Cranial nerves are intact. No cranial nerve deficit, dysarthria or facial asymmetry.     Sensory: Sensation is intact. No sensory deficit.     Motor: Motor function is intact. No weakness, tremor, atrophy, abnormal muscle tone or seizure activity.     Coordination: Coordination is intact. Coordination normal.     Gait: Gait is intact. Gait normal.     Comments: Gait sure and steady in hallway; on/off exam table and in/out of chair without difficulty; bilateral hand grasp equal 5/5  Psychiatric:        Attention and Perception: Attention and perception normal.        Mood and Affect: Mood and affect normal.        Speech: Speech normal.        Behavior: Behavior normal. Behavior is cooperative.        Thought Content: Thought content normal.        Cognition and Memory: Cognition and memory normal.        Judgment: Judgment normal.            Assessment & Plan:  A-acute rhinosinusitis and pharyngitis  P-Consider flonase 1 spray each nostril BID, saline 2 sprays each nostril q2h wa prn congestion. Continue claritin 10mg  po daily.   start augmentin 875mg  po BID x 10 days #20 RF0 dispensed from [facility] to patient  Denied personal or family history of ENT cancer.  Shower BID especially prior to bed. No evidence of systemic bacterial  infection, non toxic and well hydrated.  I do not see where any further testing or imaging is necessary at this time.   I will suggest supportive care, rest, good hygiene and encourage the patient to take adequate fluids.  The patient is to return to clinic or EMERGENCY ROOM if symptoms worsen or change significantly.  Exitcare handout on sinusitis and sinus rinse printed and given to patient.  Patient verbalized agreement and understanding of treatment plan and had no further questions at this time.   P2:  Hand washing and cover cough  Patient with history of smoking and current smoker.  Two lesions oropharynx today but also with sinusitis/post nasal drip.  Treating sinusitis with augmentin.  Patient to follow up in 2 weeks for re-evaluation of lesions oropharynx if no improvement recommend she see ENT.  Exitcare handout printed and given on pharyngitis and throat cancer.  Discussed with patient to continue monitoring lesions and if worsening follow up for re-evaluation sooner.  Discussed with patient they could be related to infection but may not be.  Usually no specific medical treatment is needed if a virus is causing the sore throat. The throat most often gets better on its own within 5 to 7 days. Antibiotic medicine does not cure viral pharyngitis. For acute pharyngitis caused by bacteria, your healthcare provider will prescribe an antibiotic.   Do not smoke.   Avoid secondhand smoke and other air pollutants.   Use a cool mist humidifier to add moisture to the air.   Get plenty of rest.   You may want to rest  your throat by talking less and eating a diet that is mostly liquid or soft for a day or two.   Nonprescription throat lozenges and mouthwashes should help relieve the soreness.   Gargling with warm saltwater and drinking warm liquids may help. (You can make a saltwater solution by adding 1/4 teaspoon of salt to 8 ounces, or 240 mL, of warm water.)   A nonprescription pain reliever such as aspirin, acetaminophen, or ibuprofen may ease general aches and pains. Patient verbalized understanding of instructions and agreed with plan of care.  P2: Hand washing and diet.

## 2019-10-22 ENCOUNTER — Encounter: Payer: Self-pay | Admitting: Registered Nurse

## 2019-10-22 ENCOUNTER — Telehealth: Payer: Self-pay | Admitting: Registered Nurse

## 2019-10-22 DIAGNOSIS — N898 Other specified noninflammatory disorders of vagina: Secondary | ICD-10-CM

## 2019-10-22 MED ORDER — FLUCONAZOLE 150 MG PO TABS: ORAL_TABLET | ORAL | 0 refills | Status: DC

## 2019-10-22 NOTE — Telephone Encounter (Signed)
Patient requested refill on diflucan 150mg  x 1 onset symptoms and may repeat once in 72 hours #2 RF0 to [facility] as PCM ordered doxycycline and she is getting symptoms of yeast infection and leaving on vacation to for a week.  Tried to call [facility] for [facility]ill to [facility] and was on hold no answer prior to her work break ending.  Patient to follow up with PCM if no improvement in symptoms with plan of care.  Electronic Rx sent to her pharmacy of choice.  Patient verbalized understanding information/instructions, agreed with plan of care and had no further questions at this time.

## 2019-11-14 DIAGNOSIS — K589 Irritable bowel syndrome without diarrhea: Secondary | ICD-10-CM | POA: Insufficient documentation

## 2019-11-14 DIAGNOSIS — G43909 Migraine, unspecified, not intractable, without status migrainosus: Secondary | ICD-10-CM | POA: Insufficient documentation

## 2020-05-12 ENCOUNTER — Telehealth: Payer: Self-pay | Admitting: Registered Nurse

## 2020-05-12 ENCOUNTER — Encounter: Payer: Self-pay | Admitting: Registered Nurse

## 2020-05-12 MED ORDER — FLUTICASONE PROPIONATE 50 MCG/ACT NA SUSP: 1.0000 | Freq: Two times a day (BID) | NASAL | 2 refills | Status: DC

## 2020-05-12 MED ORDER — SALINE SPRAY 0.65 % NA SOLN: 2.0000 | NASAL | 0 refills | Status: DC

## 2020-05-12 NOTE — Telephone Encounter (Signed)
Late entry notified by HR patient called out sick 3 Mar.  Patient contacted via telephone and stated was seen by [facility] and given flexeril and NSAIDS helping some but having trouble moving/walking/weight bearing.  Denied loss of bowel/bladder control but only comfortable position is lying in bed unable to work at this time.  Has work excuse note from [employer] through Friday and typically does not work weekends.  PCM instructed her to follow up with orthopedics if no improvement or worsening of symptoms.  Patient denied any known injury.  Had been lifting pallets the day prior but nothing out of the ordinary or feeling muscle strain at work.  Discussed with patient try epsom salt soak bath also, biofreeze gel topical, gentle arom as prolonged sitting/lying in bed can worsen back pain symptoms per research.  Discussed red flag symptoms and patient to seek same day follow up if these occur e.g. extremity weakness/falls, loss of bowel/bladder control.  Patient verbalized understanding information/instructions, agreed with plan of care and had no further questions at this time.

## 2020-05-12 NOTE — Telephone Encounter (Signed)
Patient contacted me to update on her status.  Back to work yesterday but still not able to do all of her lifting/duties.  Coworkers assisting her and she has appt pending with [facility] later this month.  Able to walk with out crutches today.  Weekend was more difficult with ambulation.  Denied loss of bowel/bladder control or worsening of symptoms/new symptoms.  Patient plans to have re-evaluation, still taking muscle relaxer and nsaids.  Patient had no further questions or concerns at this time.

## 2020-05-26 ENCOUNTER — Ambulatory Visit: Payer: Self-pay | Admitting: Registered Nurse

## 2020-05-26 ENCOUNTER — Other Ambulatory Visit: Payer: Self-pay

## 2020-05-26 VITALS — BP 119/83 | HR 84 | Temp 98.5°F

## 2020-05-26 DIAGNOSIS — J301 Allergic rhinitis due to pollen: Secondary | ICD-10-CM

## 2020-05-26 DIAGNOSIS — E785 Hyperlipidemia, unspecified: Secondary | ICD-10-CM | POA: Insufficient documentation

## 2020-05-26 DIAGNOSIS — F419 Anxiety disorder, unspecified: Secondary | ICD-10-CM | POA: Insufficient documentation

## 2020-05-26 DIAGNOSIS — F17200 Nicotine dependence, unspecified, uncomplicated: Secondary | ICD-10-CM | POA: Insufficient documentation

## 2020-05-26 DIAGNOSIS — H6983 Other specified disorders of Eustachian tube, bilateral: Secondary | ICD-10-CM

## 2020-05-26 NOTE — Progress Notes (Signed)
Subjective:    Patient ID: Sydney Spencer, female    DOB: Aug 02, 1975, 45 y.o.   MRN: [id number]  45y/o Caucasian established female pt c/o R ear pain x2 days. Woke up with sharp stabbing pain in R ear yesterday morning. Throughout day developed some drainage and nasal congestion. Reports that seems improved today. Still with R ear pain and now L ear "feeling a little funny today." Denies ear drainage, hearing changes.   [place] and noticed ear pressure coming home Sunday.  Started allergy medicine yesterday.  History of recurrent ear infections/scarring and wanted to ensure no infection present with ear pain.     Review of Systems  Constitutional: Negative for activity change, appetite change, chills, diaphoresis, fatigue and fever.  HENT: Positive for congestion, ear pain, postnasal drip, rhinorrhea, sinus pressure and sinus pain. Negative for dental problem, drooling, ear discharge, facial swelling, hearing loss, mouth sores, nosebleeds, sneezing, sore throat, tinnitus, trouble swallowing and voice change.   Eyes: Negative for photophobia and visual disturbance.  Respiratory: Negative for cough, shortness of breath, wheezing and stridor.   Cardiovascular: Negative for chest pain.  Gastrointestinal: Negative for abdominal pain, diarrhea, nausea and vomiting.  Endocrine: Negative for cold intolerance and heat intolerance.  Genitourinary: Negative for difficulty urinating.  Musculoskeletal: Negative for gait problem, neck pain and neck stiffness.  Skin: Negative for rash.  Allergic/Immunologic: Positive for environmental allergies. Negative for food allergies.  Neurological: Negative for dizziness, tremors, seizures, syncope, facial asymmetry, speech difficulty, weakness, light-headedness, numbness and headaches.  Hematological: Negative for adenopathy. Does not bruise/bleed easily.  Psychiatric/Behavioral: Negative for agitation, confusion and sleep disturbance.        Objective:   Physical Exam Vitals and nursing note reviewed.  Constitutional:      General: She is awake. She is not in acute distress.    Appearance: Normal appearance. She is well-developed, well-groomed and normal weight. She is ill-appearing. She is not toxic-appearing or diaphoretic.  HENT:     Head: Normocephalic and atraumatic.     Jaw: There is normal jaw occlusion. No trismus, tenderness, swelling, pain on movement or malocclusion.     Salivary Glands: Right salivary gland is not diffusely enlarged or tender. Left salivary gland is not diffusely enlarged or tender.     Right Ear: Hearing, ear canal and external ear normal. A middle ear effusion is present. There is no impacted cerumen.     Left Ear: Hearing, ear canal and external ear normal. A middle ear effusion is present. There is no impacted cerumen. Tympanic membrane is scarred.     Ears:      Nose: Mucosal edema, congestion and rhinorrhea present. No nasal deformity, septal deviation, signs of injury, laceration or nasal tenderness. Rhinorrhea is clear.     Right Turbinates: Enlarged and swollen. Not pale.     Left Turbinates: Enlarged and swollen. Not pale.     Right Sinus: No maxillary sinus tenderness or frontal sinus tenderness.     Left Sinus: No maxillary sinus tenderness or frontal sinus tenderness.     Mouth/Throat:     Lips: Pink. No lesions.     Mouth: Mucous membranes are moist. Mucous membranes are not pale, not dry and not cyanotic. No injury, lacerations, oral lesions or angioedema.     Dentition: Normal dentition. Does not have dentures. No dental tenderness, gingival swelling, dental caries, dental abscesses or gum lesions.     Tongue: No lesions. Tongue does not deviate from  midline.     Palate: No mass and lesions.     Pharynx: Uvula midline. Pharyngeal swelling and posterior oropharyngeal erythema present. No oropharyngeal exudate or uvula swelling.     Tonsils: No tonsillar exudate or tonsillar  abscesses.     Comments: Bilateral TMs air fluid level clear; cobblestoning posterior pharynx; bilateral allergic shiners and upper/lower eyelid swelling nonpitting 1+/4; clear discharge bilateral nares/nasal turbinates Eyes:     General: Lids are normal. Vision grossly intact. Gaze aligned appropriately. Allergic shiner present. No visual field deficit or scleral icterus.       Right eye: No foreign body, discharge or hordeolum.        Left eye: No foreign body, discharge or hordeolum.     Extraocular Movements: Extraocular movements intact.     Right eye: Normal extraocular motion and no nystagmus.     Left eye: Normal extraocular motion and no nystagmus.     Conjunctiva/sclera:     Right eye: Right conjunctiva is injected. No chemosis, exudate or hemorrhage.    Left eye: Left conjunctiva is injected. No chemosis, exudate or hemorrhage.    Pupils: Pupils are equal, round, and reactive to light. Pupils are equal.     Right eye: Pupil is round and reactive.     Left eye: Pupil is round and reactive.     Comments: Conjunctival injection 1+ bilateral bulbar and eyelid; no debris in eyelashes or eyebrows noted  Neck:     Thyroid: No thyroid mass, thyromegaly or thyroid tenderness.     Trachea: Trachea and phonation normal. No tracheal tenderness or tracheal deviation.  Cardiovascular:     Rate and Rhythm: Normal rate and regular rhythm.     Pulses: Normal pulses.          Radial pulses are 2+ on the right side and 2+ on the left side.     Heart sounds: Normal heart sounds, S1 normal and S2 normal. Heart sounds not distant. No murmur heard. No friction rub. No gallop.   Pulmonary:     Effort: Pulmonary effort is normal. No accessory muscle usage or respiratory distress.     Breath sounds: Normal breath sounds and air entry. No stridor or transmitted upper airway sounds. No decreased breath sounds, wheezing, rhonchi or rales.     Comments: Spoke full sentences without difficulty; no cough in  exam room; wearing mask due to covid 19 pandemic Chest:     Chest wall: No tenderness.  Abdominal:     General: Abdomen is flat. There is no distension.     Palpations: Abdomen is soft.  Musculoskeletal:        General: No swelling, tenderness, deformity or signs of injury. Normal range of motion.     Right shoulder: Normal.     Left shoulder: Normal.     Right elbow: Normal.     Left elbow: Normal.     Right hand: Normal.     Left hand: Normal.     Cervical back: Normal, normal range of motion and neck supple. No swelling, edema, deformity, erythema, signs of trauma, lacerations, rigidity, spasms, torticollis, tenderness or crepitus. No pain with movement, spinous process tenderness or muscular tenderness. Normal range of motion.     Thoracic back: Normal.     Lumbar back: Normal.     Right hip: Normal.     Left hip: Normal.     Right knee: Normal.     Left knee: Normal.     Right  lower leg: No edema.     Left lower leg: No edema.  Lymphadenopathy:     Head:     Right side of head: No submental, submandibular, tonsillar, preauricular, posterior auricular or occipital adenopathy.     Left side of head: No submental, submandibular, tonsillar, preauricular, posterior auricular or occipital adenopathy.     Cervical: No cervical adenopathy.     Right cervical: No superficial, deep or posterior cervical adenopathy.    Left cervical: No superficial, deep or posterior cervical adenopathy.  Skin:    General: Skin is warm and dry.     Capillary Refill: Capillary refill takes less than 2 seconds.     Coloration: Skin is not ashen, cyanotic, jaundiced, mottled, pale or sallow.     Findings: Abrasion and rash present. No abscess, acne, bruising, burn, ecchymosis, erythema, signs of injury, laceration, lesion, petechiae or wound. Rash is macular.     Nails: There is no clubbing.          Comments: Bilateral upper and lower eyelids pink (patient rubbing eyes in exam room) skin warm dry  nonpitting 1+ edema localized  Neurological:     General: No focal deficit present.     Mental Status: She is alert and oriented to person, place, and time. Mental status is at baseline. She is not disoriented.     GCS: GCS eye subscore is 4. GCS verbal subscore is 5. GCS motor subscore is 6.     Cranial Nerves: Cranial nerves are intact. No cranial nerve deficit, dysarthria or facial asymmetry.     Sensory: Sensation is intact. No sensory deficit.     Motor: Motor function is intact. No weakness, tremor, atrophy, abnormal muscle tone or seizure activity.     Coordination: Coordination is intact. Coordination normal.     Gait: Gait is intact. Gait normal.     Comments: Gait sure and steady in clinic; in/out of chair and on/off exam table without difficulty; bilateral hand grasp equal 5/5  Psychiatric:        Attention and Perception: Attention and perception normal.        Mood and Affect: Mood and affect normal.        Speech: Speech normal.        Behavior: Behavior normal. Behavior is cooperative.        Thought Content: Thought content normal.        Cognition and Memory: Cognition and memory normal.        Judgment: Judgment normal.      Bilateral air fluid level clear TMs visible intact; scarring lower 1/3 left opaque.  Cobblestoning posterior pharynx; bilateral allergic shiners and swelling diffuse upper and lower eyelids and noted patient has been rubbing eyelids/irritation/dry skin.     Assessment & Plan:  A-bilateral eustachian tube dysfunction, seasonal allergic rhinitis  P- No evidence of invasive bacterial infection, non toxic and well hydrated.  I do not see where any further testing or imaging is necessary at this time.   I will suggest supportive care, rest, good hygiene and encourage the patient to take adequate fluids.  The patient is to return to clinic or EMERGENCY ROOM if symptoms worsen or change significantly e.g. ear pain, fever, purulent discharge from ears or  bleeding.  Exitcare handout on eustachian tube dysfunction.  Discussed with patient post nasal drip irritates throat/causes swelling blocks eustachian tubes from draining and fluid fills up middle ear.  Bacteria/viruses can grow in fluid and with moving head  tube compressed and increases pressure in tube/ear worsening pain.  Studies show will take 30 days for fluid to resolve after post nasal drip controlled with nasal steroid/antihistamine. Antibiotics and steroids do not speed up fluid removal.  Patient verbalized agreement and understanding of treatment plan and had no further questions at this time.  Patient may use normal saline nasal spray 2 sprays each nostril q2h wa as needed. flonase 1 spray each nostril BID OTC.  Patient denied personal or family history of ENT cancer.  OTC antihistamine of choice claritin/zyrtec 10mg  po daily.  Avoid triggers if possible.  Shower prior to bedtime if exposed to triggers.  If allergic dust/dust mites recommend mattress/pillow covers/encasements; washing linens, vacuuming, sweeping, dusting weekly.  Call or return to clinic as needed if these symptoms worsen or fail to improve as anticipated.   Exitcare handout on allergic rhinitis and sinus rinse.  Patient verbalized understanding of instructions, agreed with plan of care and had no further questions at this time.  P2:  Avoidance and hand washing.  Cleared for work discussed. Patient may apply warm or cool packs prn right eye 5 minutes TID prn or warm compress.  Refresh drops 2 ou TID prn during allergy season may consider adding ketotifen 1 gtt each eye BID OTC if no relief with po antihistamine and refresh.  Discussed blinking irritates eye blood vessels further as eyelid vessels rub on bulbar vessals. Instructed patient to not rub eyes. Shower after work to clear off dust/pollen since she works in [employer].   May need to wash pillowcases more frequently until infection resolves if discharge  noted on pillow  Return to clinic if headache, fever greater than 100.2F, nausea/vomiting, purulent discharge/matting unable to open eye without using fingers after 24 hours of medication use, foreign body sensation, ciliary flush, worsening photophobia or vision. See optometrist same day if visual field loss, worsening light sensitivity, orbital swelling.  Call or return to clinic as needed if these symptoms worsen or fail to improve as anticipated.  Patient verbalized agreement and understanding of treatment plan and had no further questions at this time.  P2: Hand washing, avoid contact use-wear glasses.

## 2020-05-26 NOTE — Patient Instructions (Signed)
How to Perform a Sinus Rinse A sinus rinse is a home treatment that is used to rinse your sinuses with a sterile mixture of salt and water (saline solution). Sinuses are air-filled spaces in your skull behind the bones of your face and forehead that open into your nasal cavity. A sinus rinse can help to clear mucus, dirt, dust, or pollen from your nasal cavity. You may do a sinus rinse when you have a cold, a virus, nasal allergy symptoms, a sinus infection, or stuffiness in your nose or sinuses. Talk with your health care provider about whether a sinus rinse might help you. What are the risks? A sinus rinse is generally safe and effective. However, there are a few risks, which include:  A burning sensation in your sinuses. This may happen if you do not make the saline solution as directed. Be sure to follow all directions when making the saline solution.  Nasal irritation.  Infection from contaminated water. This is rare, but possible. Do not do a sinus rinse if you have had ear or nasal surgery, ear infection, or blocked ears. Supplies needed:  Saline solution or powder.  Distilled or sterile water to mix with saline powder. ? You may use boiled and cooled tap water. Boil tap water for 5 minutes; cool until it is lukewarm. Use within 24 hours. ? Do not use regular tap water to mix with the saline solution.  Neti pot or nasal rinse bottle. These supplies release the saline solution into your nose and through your sinuses. Neti pots and nasal rinse bottles can be purchased at [employer], a health food store, or online. How to perform a sinus rinse 1. Wash your hands with soap and water. 2. Wash your device according to the directions that came with the product and then dry it. 3. Use the solution that comes with your product or one that is sold separately in stores. Follow the mixing directions on the package to mix with sterile or distilled water. 4. Fill the device with the  amount of saline solution noted in the device instructions. 5. Stand over a sink and tilt your head sideways over the sink. 6. Place the spout of the device in your upper nostril (the one closer to the ceiling). 7. Gently pour or squeeze the saline solution into your nasal cavity. The liquid should drain out from the lower nostril if you are not too congested. 8. While rinsing, breathe through your open mouth. 9. Gently blow your nose to clear any mucus and rinse solution. Blowing too hard may cause ear pain. 10. Repeat in your other nostril. 11. Clean and rinse your device with clean water and then air-dry it. Talk with your health care provider or pharmacist if you have questions about how to do a sinus rinse.   Summary  A sinus rinse is a home treatment that is used to rinse your sinuses with a sterile mixture of salt and water (saline solution).  A sinus rinse is generally safe and effective. Follow all instructions carefully.  Before doing a sinus rinse, talk with your health care provider about whether it would be helpful for you. This information is not intended to replace advice given to you by your health care provider. Make sure you discuss any questions you have with your health care provider. Document Revised: 12/03/2019 Document Reviewed: 12/03/2019 Elsevier Patient Education  2021 [employer]. Allergic Rhinitis, Adult  Allergic rhinitis is an allergic reaction that affects the mucous  membrane inside the nose. The mucous membrane is the tissue that produces mucus. There are two types of allergic rhinitis:  Seasonal. This type is also called hay fever and happens only during certain seasons.  Perennial. This type can happen at any time of the year. Allergic rhinitis cannot be spread from person to person. This condition can be mild, moderate, or severe. It can develop at any age and may be outgrown. What are the causes? This condition is caused by allergens. These are  things that can cause an allergic reaction. Allergens may differ for seasonal allergic rhinitis and perennial allergic rhinitis.  Seasonal allergic rhinitis is triggered by pollen. Pollen can come from grasses, trees, and weeds.  Perennial allergic rhinitis may be triggered by: ? Dust mites. ? Proteins in a pet's urine, saliva, or dander. Dander is dead skin cells from a pet. ? Smoke, mold, or car fumes. What increases the risk? You are more likely to develop this condition if you have a family history of allergies or other conditions related to allergies, including:  Allergic conjunctivitis. This is inflammation of parts of the eyes and eyelids.  Asthma. This condition affects the lungs and makes it hard to breathe.  Atopic dermatitis or eczema. This is long term (chronic) inflammation of the skin.  Food allergies. What are the signs or symptoms? Symptoms of this condition include:  Sneezing or coughing.  A stuffy nose (nasal congestion), itchy nose, or nasal discharge.  Itchy eyes and tearing of the eyes.  A feeling of mucus dripping down the back of your throat (postnasal drip).  Trouble sleeping.  Tiredness or fatigue.  Headache.  Sore throat. How is this diagnosed? This condition may be diagnosed with your symptoms, medical history, and physical exam. Your health care provider may check for related conditions, such as:  Asthma.  Pink eye. This is eye inflammation caused by infection (conjunctivitis).  Ear infection.  Upper respiratory infection. This is an infection in the nose, throat, or upper airways. You may also have tests to find out which allergens trigger your symptoms. These may include skin tests or blood tests. How is this treated? There is no cure for this condition, but treatment can help control symptoms. Treatment may include:  Taking medicines that block allergy symptoms, such as corticosteroids and antihistamines. Medicine may be given as a  shot, nasal spray, or pill.  Avoiding any allergens.  Being exposed again and again to tiny amounts of allergens to help you build a defense against allergens (immunotherapy). This is done if other treatments have not helped. It may include: ? Allergy shots. These are injected medicines that have small amounts of allergen in them. ? Sublingual immunotherapy. This involves taking small doses of a medicine with allergen in it under your tongue. If these treatments do not work, your health care provider may prescribe newer, stronger medicines. Follow these instructions at home: Avoiding allergens Find out what you are allergic to and avoid those allergens. These are some things you can do to help avoid allergens:  If you have perennial allergies: ? Replace carpet with wood, tile, or vinyl flooring. Carpet can trap dander and dust. ? Do not smoke. Do not allow smoking in your home. ? Change your heating and air conditioning filters at least once a month.  If you have seasonal allergies, take these steps during allergy season: ? Keep windows closed as much as possible. ? Plan outdoor activities when pollen counts are lowest. Check pollen counts before  you plan outdoor activities. ? When coming indoors, change clothing and shower before sitting on furniture or bedding.  If you have a pet in the house that produces allergens: ? Keep the pet out of the bedroom. ? Vacuum, sweep, and dust regularly. General instructions  Take over-the-counter and prescription medicines only as told by your health care provider.  Drink enough fluid to keep your urine pale yellow.  Keep all follow-up visits as told by your health care provider. This is important. Where to find more information  American Academy of Allergy, Asthma & Immunology: www.aaaai.org Contact a health care provider if:  You have a fever.  You develop a cough that does not go away.  You make whistling sounds when you breathe  (wheeze).  Your symptoms slow you down or stop you from doing your normal activities each day. Get help right away if:  You have shortness of breath. This symptom may represent a serious problem that is an emergency. Do not wait to see if the symptom will go away. Get medical help right away. Call your local emergency services (911 in the [place] Do not drive yourself to the hospital. Summary  Allergic rhinitis may be managed by taking medicines as directed and avoiding allergens.  If you have seasonal allergies, keep windows closed as much as possible during allergy season.  Contact your health care provider if you develop a fever or a cough that does not go away. This information is not intended to replace advice given to you by your health care provider. Make sure you discuss any questions you have with your health care provider. Document Revised: 04/12/2019 Document Reviewed: 02/19/2019 Elsevier Patient Education  2021 [employer]. Eustachian Tube Dysfunction  Eustachian tube dysfunction refers to a condition in which a blockage develops in the narrow passage that connects the middle ear to the back of the nose (eustachian tube). The eustachian tube regulates air pressure in the middle ear by letting air move between the ear and nose. It also helps to drain fluid from the middle ear space. Eustachian tube dysfunction can affect one or both ears. When the eustachian tube does not function properly, air pressure, fluid, or both can build up in the middle ear. What are the causes? This condition occurs when the eustachian tube becomes blocked or cannot open normally. Common causes of this condition include:  Ear infections.  Colds and other infections that affect the nose, mouth, and throat (upper respiratory tract).  Allergies.  Irritation from cigarette smoke.  Irritation from stomach acid coming up into the esophagus (gastroesophageal reflux). The esophagus is the tube that  carries food from the mouth to the stomach.  Sudden changes in air pressure, such as from descending in an airplane or scuba diving.  Abnormal growths in the nose or throat, such as: ? Growths that line the nose (nasal polyps). ? Abnormal growth of cells (tumors). ? Enlarged tissue at the back of the throat (adenoids). What increases the risk? You are more likely to develop this condition if:  You smoke.  You are overweight.  You are a child who has: ? Certain birth defects of the mouth, such as cleft palate. ? Large tonsils or adenoids. What are the signs or symptoms? Common symptoms of this condition include:  A feeling of fullness in the ear.  Ear pain.  Clicking or popping noises in the ear.  Ringing in the ear.  Hearing loss.  Loss of balance.  Dizziness. Symptoms may get  worse when the air pressure around you changes, such as when you travel to an area of high elevation, fly on an airplane, or go scuba diving. How is this diagnosed? This condition may be diagnosed based on:  Your symptoms.  A physical exam of your ears, nose, and throat.  Tests, such as those that measure: ? The movement of your eardrum (tympanogram). ? Your hearing (audiometry). How is this treated? Treatment depends on the cause and severity of your condition.  In mild cases, you may relieve your symptoms by moving air into your ears. This is called "popping the ears."  In more severe cases, or if you have symptoms of fluid in your ears, treatment may include: ? Medicines to relieve congestion (decongestants). ? Medicines that treat allergies (antihistamines). ? Nasal sprays or ear drops that contain medicines that reduce swelling (steroids). ? A procedure to drain the fluid in your eardrum (myringotomy). In this procedure, a small tube is placed in the eardrum to:  Drain the fluid.  Restore the air in the middle ear space. ? A procedure to insert a balloon device through the nose to  inflate the opening of the eustachian tube (balloon dilation). Follow these instructions at home: Lifestyle  Do not do any of the following until your health care provider approves: ? Travel to high altitudes. ? Fly in airplanes. ? Work in a Estate agent or room. ? Scuba dive.  Do not use any products that contain nicotine or tobacco, such as cigarettes and e-cigarettes. If you need help quitting, ask your health care provider.  Keep your ears dry. Wear fitted earplugs during showering and bathing. Dry your ears completely after. General instructions  Take over-the-counter and prescription medicines only as told by your health care provider.  Use techniques to help pop your ears as recommended by your health care provider. These may include: ? Chewing gum. ? Yawning. ? Frequent, forceful swallowing. ? Closing your mouth, holding your nose closed, and gently blowing as if you are trying to blow air out of your nose.  Keep all follow-up visits as told by your health care provider. This is important. Contact a health care provider if:  Your symptoms do not go away after treatment.  Your symptoms come back after treatment.  You are unable to pop your ears.  You have: ? A fever. ? Pain in your ear. ? Pain in your head or neck. ? Fluid draining from your ear.  Your hearing suddenly changes.  You become very dizzy.  You lose your balance. Summary  Eustachian tube dysfunction refers to a condition in which a blockage develops in the eustachian tube.  It can be caused by ear infections, allergies, inhaled irritants, or abnormal growths in the nose or throat.  Symptoms include ear pain, hearing loss, or ringing in the ears.  Mild cases are treated with maneuvers to unblock the ears, such as yawning or ear popping.  Severe cases are treated with medicines. Surgery may also be done (rare). This information is not intended to replace advice given to you by your health  care provider. Make sure you discuss any questions you have with your health care provider. Document Revised: 06/13/2017 Document Reviewed: 06/13/2017 Elsevier Patient Education  2021 [employer].

## 2020-08-13 ENCOUNTER — Telehealth: Payer: Self-pay | Admitting: *Deleted

## 2020-08-13 ENCOUNTER — Encounter: Payer: Self-pay | Admitting: *Deleted

## 2020-08-13 DIAGNOSIS — B360 Pityriasis versicolor: Secondary | ICD-10-CM

## 2020-08-13 NOTE — Telephone Encounter (Signed)
Clinic notified by HR that pt called out of work today citing possible covid sx.  Spoke with pt by phone. She cites HA, nasal congestion, earache. Sx began yesterday 6/8. Is house/dog sitting and thought itchy face and eyes she started having was related to the dogs. Sts R eye was swollen this morning. 97.8 temp this morning. 99.2 now while on phone.   Skin cyst on top of R ear just at fold at temple that is pus-filled and became more inflamed overnight. Cyst present >1 month.   Starting Doxycycline this afternoon as she has a standing Rx from her pcp for skin cysts. Trying to get in with pcp regarding cyst. Waiting for phone call back from triage.   Day 0 6/8 Day 5 6/13 Testing [facility] [address] 6/11 RTW 6/14 with strict mask use thru 6/18  Pt denies further questions or concerns.

## 2020-08-13 NOTE — Telephone Encounter (Signed)
Reviewed RN Haley note agreed with plan of care. 

## 2020-08-15 NOTE — Telephone Encounter (Signed)
Patient returned call went for testing today was told to expect results tomorrow for covid testing.  Some cough in am yellow clear.  Started doxycycline as discussed with RN Rolly Salter previously for infected ear cyst some pain/ear clogged.  Rx from her PCM.  Fever prior to starting doxycycline but now resolved.  Discussed with patient typical viral URI will have clear then yellow then green mucous before resolving.  Spoke full sentences without difficulty, A&Ox3 no cough/throat clearing or congestion noted during 5 minute telephone call.  Patient to call me tomorrow when she has test results or if phlegm brown/cloudy/bloody.  Patient verbalized understanding information/instructions, agreed with plan of care and had no further questions at this time.

## 2020-08-16 NOTE — Telephone Encounter (Signed)
Patient contacted via telephone stated feeling better today less congestion nasal and energy level higher. Right eye swelling has resolved.  Left lymph nodes no longer swollen. Some chest congestion especially in the morning.  Right ear cyst seems to be filled with blood now per patient and is the size of a shooter marble maybe slightly less.  Temperature no longer hot to touch but still warm.  Denied red streaks from cyst down neck or into scalp.  Denied fever/chills Tmax 99.2 on Friday 08/14/20.  when she checked twice a day.  Still hasn't received her test results back yet.  She is taking Vitamin D3, Vitamin C, zinc and elderberry syrup.  Patient with occasional nonproductive cough during 12 minute telephone call.  No nasal sniffing/congestion or throat clearing.  Patient spoke full sentences without difficulty A&Ox3.  Will call me back when [facility].  Patient verbalized understanding information/instructions, agreed with plan of care and had no further questions at this time.

## 2020-08-17 NOTE — Telephone Encounter (Signed)
Spoke with pt by phone. She reports that [facility] her today and notified her that her test was inconclusive and wants her to retest. Pt is asymptomatic and wanting to RTW tomorrow 6/14 as previously planned. [facility]. Located [facility] 2 hr rapid test thru [employer]. Scheduled pt for 4:15 this afternoon. She will call NP Inetta Fermo with results in order to be cleared for return tomorrow if appropriate.

## 2020-08-17 NOTE — Telephone Encounter (Signed)
Patient contacted NP rapid test results negative today.  Ear better a little less hard on the lump/cyst.  Patient plans to call her PCM to have cyst I&D'd now that her covid test is negative.  Discussed with patient [facility] is a minimal invasive procedure clinic as no local anesthetics or controlled substances per contract.  All other symptoms have resolved.  Patient cleared to return onsite tomorrow with strict mask wear through 6/18.  Patient to stop in clinic tomorrow so ear can be checked. Patient A&Ox3 spoke full sentences without difficulty; no cough/nasal congestion/sniffing or throat clearing during 10 minute telephone call.  Patient verbalized understanding information/instructions, agreed with plan of care and had no further questions at this time.  HR notified.

## 2020-08-18 NOTE — Telephone Encounter (Signed)
Pt did RTW today as expected. Denies needs or concerns.

## 2020-08-19 NOTE — Telephone Encounter (Signed)
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  Mask straps rubbing on swollen area right pinna.  Not hot to touch, skin warm dry and pink.  No lymphadenopathy preauricular or post auricular/cervical.  Spoke full sentences without difficulty, respirations even and unlabored.  Patient given surgical masks from clinic that had head versus ear straps to avoid rubbing on inflamed area right ear.  Finish doxycycline from [facility] and follow up for re-evaluation if new or worsening symptoms.  Patient verbalized understanding information/instructions, agreed with plan of care and had no further questions at this time.

## 2020-08-19 NOTE — Telephone Encounter (Signed)
Mask wear at work continues through 08/22/20.

## 2020-08-20 MED ORDER — SELENIUM SULFIDE 1 % EX LOTN: 1.0000 "application " | TOPICAL_LOTION | Freq: Every day | CUTANEOUS | Status: AC

## 2020-08-20 NOTE — Telephone Encounter (Signed)
Patient seen in workcenter.  Surgical mask straps kept moving yesterday and mask not fitting well so returned to cloth ear loop masks.  Patient stated more comfortable and not rubbing on pinna/face. No further questions or concerns regarding ear cyst/infection at this time.  A&OX3 spoke full sentences without difficulty respirations even and unlabored.  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 Patient has noticed rash left forearm stated itchy at times concerned for skin cancer reoccurs every spring and resolves in the winter.  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  If she has skin area that has worsening rash/enlarging/bleeding/painful/changing colors to have it evaluated by a provider.  Medication as directed. Call or return to clinic as needed if these symptoms worsen or fail to improve as anticipated. Exitcare handout on tinea versicolor  Discussed hygiene with patient e.g. do not stay in sweat soaked clothes/change mask if wet mid shift at work and shower with selsun blue shampoo prn (apply and leave on skin for 10 minutes to affected areas then rinse off).  Reoccurrence of condition common and may require retreatment. Sometimes using blowdryer to dry skin thoroughly also helpful.  Patient verbalized agreement and understanding of treatment plan and had no further questions at this time. P2: Avoidance and hand washing.

## 2020-08-25 NOTE — Telephone Encounter (Signed)
Patient seen in workcenter today.  Ear has returned to normal no further questions or concerns at this time.

## 2020-08-25 NOTE — Telephone Encounter (Signed)
Day 10 08/22/20 strict mask use complete but patient still wearing mask as employer now requiring due to high covid transmission rate in [place].  A&Ox3 spoke full sentences without difficulty no cough/congestion/throat clearing.  Skin warm dry and pink

## 2021-04-19 ENCOUNTER — Encounter: Payer: Self-pay | Admitting: *Deleted

## 2021-04-19 ENCOUNTER — Telehealth: Payer: Self-pay | Admitting: *Deleted

## 2021-04-19 DIAGNOSIS — L03119 Cellulitis of unspecified part of limb: Secondary | ICD-10-CM

## 2021-04-19 MED ORDER — SULFAMETHOXAZOLE-TRIMETHOPRIM 800-160 MG PO TABS: 1.0000 | ORAL_TABLET | Freq: Two times a day (BID) | ORAL | 0 refills | Status: AC

## 2021-04-19 MED ORDER — ACETAMINOPHEN 500 MG PO TABS: 1000.0000 mg | ORAL_TABLET | Freq: Four times a day (QID) | ORAL | 0 refills | Status: AC | PRN

## 2021-04-19 NOTE — Telephone Encounter (Signed)
RN Rolly Salter contacted NP via telephone discussed case and paper chart review by RN Rolly Salter located onsite [facility] telephonically with NP.  Last antibiotics from [facility] clinic 2021 from [facility].  Has tolerated bactrim DS in the past.  Start bactrim DS po BID x 7 days dispensed from [facility] #40 to patient today.  Re-evaluation tomorrow with NP appt.  May continue epsom salt soaks and tylenol 1000mg  po q6h prn pain/swelling or nsaid of choice OTC.  Reviewed RN note agreed with plan of care.

## 2021-04-19 NOTE — Telephone Encounter (Signed)
Pt in to clinic stating "I got a staph infection from here on Friday from being down on my knees on the carpet." Sts knee was swollen, red, and tender on Saturday. Has been doing saltwater soaks and "got the head down in size" but still tender, red, with a yellow central head to area. Full area of redness approx size of [place] quarter. Requesting abx.  Advised NP not in clinic until tomorrow but message will be sent to her for recommendations if she is available. If not available, appt will be scheduled for tomorrow.  Reviewed case and previous abx dispenses with NP by phone. Orders received for Bactrim DS 800/160mg  po q12h x7 days #40 RF0 and clinic appt for tomorrow.  Pt provided with abx, instructions and appt time of 1000 on 2/14.

## 2021-04-20 ENCOUNTER — Other Ambulatory Visit: Payer: Self-pay

## 2021-04-20 ENCOUNTER — Encounter: Payer: Self-pay | Admitting: Registered Nurse

## 2021-04-20 ENCOUNTER — Ambulatory Visit: Payer: Self-pay | Admitting: Registered Nurse

## 2021-04-20 VITALS — BP 134/90 | HR 97

## 2021-04-20 DIAGNOSIS — J301 Allergic rhinitis due to pollen: Secondary | ICD-10-CM

## 2021-04-20 DIAGNOSIS — L03115 Cellulitis of right lower limb: Secondary | ICD-10-CM

## 2021-04-20 DIAGNOSIS — I10 Essential (primary) hypertension: Secondary | ICD-10-CM | POA: Insufficient documentation

## 2021-04-20 MED ORDER — FLUTICASONE PROPIONATE 50 MCG/ACT NA SUSP: 1.0000 | Freq: Two times a day (BID) | NASAL | 2 refills | Status: AC

## 2021-04-20 MED ORDER — SALINE SPRAY 0.65 % NA SOLN: 2.0000 | NASAL | 0 refills | Status: AC

## 2021-04-20 NOTE — Patient Instructions (Signed)
Continue Bactrim DS by mouth every 12 hours x 7 days Avoid kneeling on affected knee-sit or squat instead Continue epsom salt soaks/salt scrubs May use tylenol 1000mg  by mouth every 6 hours as needed for pain  Bursitis Bursitis is inflammation and irritation of a bursa, which is one of the small, fluid-filled sacs that cushion and protect the moving parts of your body. These sacs are located between bones and muscles, bones and muscle attachments, or bones and skin areas that are next to bones. A bursa protects those structures from the wear and tear that results from frequent movement. An inflamed bursa causes pain and swelling. Fluid may build up inside the sac. Bursitis is most common near joints, especially the knees, elbows, hips, and shoulders. What are the causes? This condition may be caused by: Injury from: A direct hit (blow), like falling on your knee or elbow. Overuse of a joint (repetitive stress). Infection. This can happen if bacteria get into a bursa through a cut or scrape near a joint. Diseases that cause joint inflammation, such as gout and rheumatoid arthritis. What increases the risk? You are more likely to develop this condition if you: Have a job or hobby that involves a lot of repetitive stress on your joints. Have a condition that weakens your body's defense system (immune system), such as diabetes, cancer, or HIV. Do any of these often: Lift and reach overhead. Kneel or lean on hard surfaces. Run or walk. What are the signs or symptoms? The most common symptoms of this condition include: Pain that gets worse when you move the affected body part or use it to support (bear) your body weight. Inflammation. Stiffness. Other symptoms include: Redness. Swelling. Tenderness. Warmth. Pain that continues after rest. Fever or chills. These may occur in bursitis that is caused by infection. How is this diagnosed? This condition may be diagnosed based on: Your  medical history and a physical exam. Imaging tests, such as an MRI. A procedure to drain fluid from the bursa with a needle (aspiration). The fluid may be checked for signs of infection or gout. Blood tests to rule out other causes of inflammation. How is this treated? This condition can usually be treated at home with rest, ice, applying pressure (compression), and raising the body part that is affected (elevation). This is called RICE therapy. For mild bursitis, RICE therapy may be all you need. Other treatments may include: NSAIDs to treat pain and inflammation. Corticosteroid medicines to fight inflammation. These medicines may be injected into and around the area of bursitis. Aspiration of fluid from the bursa to relieve pain and improve movement. Antibiotic medicine to treat an infected bursa. A splint, brace, or walking aid, such as a cane. Physical therapy if you continue to have pain or limited movement. Surgery to remove a damaged or infected bursa. This may be needed if other treatments have not worked. Follow these instructions at home: Medicines Take over-the-counter and prescription medicines only as told by your health care provider. If you were prescribed an antibiotic medicine, take it as told by your health care provider. Do not stop taking the antibiotic even if you start to feel better. General instructions  Rest the affected area as told by your health care provider. If possible, raise (elevate) the affected area above the level of your heart while you are sitting or lying down. Avoid activities that make pain worse. Use splints, braces, pads, or walking aids as told by your health care provider. If directed,  put ice on the affected area: If you have a removable splint or brace, remove it as told by your health care provider. Put ice in a plastic bag. Place a towel between your skin and the bag or between your splint or brace and the bag. Leave the ice on for 20  minutes, 2-3 times a day. Keep all follow-up visits as told by your health care provider. This is important. Preventing future episodes Take actions to help prevent future episodes of bursitis. Wear knee pads if you kneel often. Wear sturdy running or walking shoes that fit you well. Take breaks regularly from repetitive activity. Warm up by stretching before doing any activity that takes a lot of effort. Maintain a healthy weight or lose weight as recommended by your health care provider. If you need help doing this, ask your health care provider. Exercise regularly. Start any new physical activity gradually. Contact a health care provider if you: Have a fever. Have chills. Have bursitis that is not getting better with treatment or home care. Summary Bursitis is inflammation and irritation of a bursa, which is one of the small, fluid-filled sacs that cushion and protect the moving parts of your body. An inflamed bursa causes pain and swelling. Bursitis is commonly diagnosed with a physical exam, but other tests are sometimes needed. This condition can usually be treated at home with rest, ice, applying pressure (compression), and raising the body part that is affected (elevation). This is called RICE therapy. This information is not intended to replace advice given to you by your health care provider. Make sure you discuss any questions you have with your health care provider. Document Revised: 07/31/2019 Document Reviewed: 08/28/2019 Elsevier Patient Education  2022 [employer] Cellulitis, Adult Cellulitis is a skin infection. The infected area is usually warm, red, swollen, and tender. This condition occurs most often in the arms and lower legs. The infection can travel to the muscles, blood, and underlying tissue and become serious. It is very important to get treated for this condition. What are the causes? Cellulitis is caused by bacteria. The bacteria enter through a break in the  skin, such as a cut, burn, insect bite, open sore, or crack. What increases the risk? This condition is more likely to occur in people who: Have a weak body defense system (immune system). Have open wounds on the skin, such as cuts, burns, bites, and scrapes. Bacteria can enter the body through these open wounds. Are older than 48 years of age. Have diabetes. Have a type of long-lasting (chronic) liver disease (cirrhosis) or kidney disease. Are obese. Have a skin condition such as: Itchy rash (eczema). Slow movement of blood in the veins (venous stasis). Fluid buildup below the skin (edema). Have had radiation therapy. Use IV drugs. What are the signs or symptoms? Symptoms of this condition include: Redness, streaking, or spotting on the skin. Swollen area of the skin. Tenderness or pain when an area of the skin is touched. Warm skin. A fever. Chills. Blisters. How is this diagnosed? This condition is diagnosed based on a medical history and physical exam. You may also have tests, including: Blood tests. Imaging tests. How is this treated? Treatment for this condition may include: Medicines, such as antibiotic medicines or medicines to treat allergies (antihistamines). Supportive care, such as rest and application of cold or warm cloths (compresses) to the skin. Hospital care, if the condition is severe. The infection usually starts to get better within 1-2 days of treatment. Follow  these instructions at home: Medicines Take over-the-counter and prescription medicines only as told by your health care provider. If you were prescribed an antibiotic medicine, take it as told by your health care provider. Do not stop taking the antibiotic even if you start to feel better. General instructions Drink enough fluid to keep your urine pale yellow. Do not touch or rub the infected area. Raise (elevate) the infected area above the level of your heart while you are sitting or lying  down. Apply warm or cold compresses to the affected area as told by your health care provider. Keep all follow-up visits as told by your health care provider. This is important. These visits let your health care provider make sure a more serious infection is not developing. Contact a health care provider if: You have a fever. Your symptoms do not begin to improve within 1-2 days of starting treatment. Your bone or joint underneath the infected area becomes painful after the skin has healed. Your infection returns in the same area or another area. You notice a swollen bump in the infected area. You develop new symptoms. You have a general ill feeling (malaise) with muscle aches and pains. Get help right away if: Your symptoms get worse. You feel very sleepy. You develop vomiting or diarrhea that persists. You notice red streaks coming from the infected area. Your red area gets larger or turns dark in color. These symptoms may represent a serious problem that is an emergency. Do not wait to see if the symptoms will go away. Get medical help right away. Call your local emergency services (911 in the [place] Do not drive yourself to the hospital. Summary Cellulitis is a skin infection. This condition occurs most often in the arms and lower legs. Treatment for this condition may include medicines, such as antibiotic medicines or antihistamines. Take over-the-counter and prescription medicines only as told by your health care provider. If you were prescribed an antibiotic medicine, do not stop taking the antibiotic even if you start to feel better. Contact a health care provider if your symptoms do not begin to improve within 1-2 days of starting treatment or your symptoms get worse. Keep all follow-up visits as told by your health care provider. This is important. These visits let your health care provider make sure that a more serious infection is not developing. This information is not intended  to replace advice given to you by your health care provider. Make sure you discuss any questions you have with your health care provider. Document Revised: 03/04/2019 Document Reviewed: 07/13/2017 Elsevier Patient Education  2022 [employer].

## 2021-04-20 NOTE — Progress Notes (Signed)
Subjective:    Patient ID: Sydney Spencer, female    DOB: 09-10-1975, 46 y.o.   MRN: [id number]  46y/o single established female here for re-evaluation right knee pain/swelling/discharge after starting bactrim DS yesterday for cellulitis.  Symptoms started after moving cubicles and kneeling on carpet to reinstall desks/privacy walls.  Home treatment was helping over the weekend but not resolving symptoms.  Patient reported she did salt paste over right knee again last night and it brought it to a head again that drained.  Denied fever/chills, spreading redness/expanding swelling.  Swelling decreased from yesterday.  Patient does not have doxycycline at home any longer as PCM Maurice Small left office and she has not established with new PCM as she is trying to locate where provider has moved to to move with PCM.  Patient reported some tenderness noted distal lateral and medial right knee today but wearing knee high socks today and unsure if pain from socks compression or wore jeans yesterday and it rubbed irritated skin so wearing shorts today.  No kneeling work planned for today.  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     Review of Systems  Constitutional:  Negative for activity change, appetite change, chills, diaphoresis, fatigue and fever.  HENT:  Negative for trouble swallowing and voice change.   Eyes:  Negative for photophobia and visual disturbance.  Respiratory:  Negative for cough, shortness of breath, wheezing and stridor.   Cardiovascular:  Positive for leg swelling.  Gastrointestinal:  Negative for diarrhea, nausea and vomiting.  Endocrine: Negative for cold intolerance and heat intolerance.  Genitourinary:  Negative for difficulty urinating.  Musculoskeletal:  Positive for arthralgias, joint swelling and myalgias. Negative for back pain, gait problem, neck pain and neck stiffness.  Skin:  Positive for color change and rash. Negative for pallor and wound.   Allergic/Immunologic: Positive for environmental allergies. Negative for food allergies.  Neurological:  Negative for dizziness, tremors, seizures, syncope, facial asymmetry, speech difficulty, weakness, light-headedness, numbness and headaches.  Hematological:  Negative for adenopathy. Does not bruise/bleed easily.  Psychiatric/Behavioral:  Negative for agitation, confusion and sleep disturbance.       Objective:   Physical Exam Vitals and nursing note reviewed.  Constitutional:      General: She is awake. She is not in acute distress.    Appearance: Normal appearance. She is well-developed and well-groomed. She is not ill-appearing, toxic-appearing or diaphoretic.  HENT:     Head: Normocephalic and atraumatic.     Jaw: There is normal jaw occlusion.     Salivary Glands: Right salivary gland is not diffusely enlarged. Left salivary gland is not diffusely enlarged.     Right Ear: Hearing and external ear normal.     Left Ear: Hearing and external ear normal.     Nose: Nose normal. No congestion or rhinorrhea.     Mouth/Throat:     Lips: Pink. No lesions.     Mouth: Mucous membranes are moist. No oral lesions.     Pharynx: Oropharynx is clear. Uvula midline.  Eyes:     General: Lids are normal. Vision grossly intact. Gaze aligned appropriately. Allergic shiner present. No scleral icterus.       Right eye: No discharge.        Left eye: No discharge.     Extraocular Movements: Extraocular movements intact.     Conjunctiva/sclera: Conjunctivae normal.     Pupils: Pupils are equal, round, and reactive to light.  Neck:  Trachea: Trachea and phonation normal. No tracheal deviation.  Cardiovascular:     Rate and Rhythm: Normal rate and regular rhythm.     Pulses: Normal pulses.          Radial pulses are 2+ on the right side and 2+ on the left side.  Pulmonary:     Effort: Pulmonary effort is normal. No respiratory distress.     Breath sounds: Normal breath sounds and air entry.  No stridor, decreased air movement or transmitted upper airway sounds. No decreased breath sounds or wheezing.     Comments: Spoke full sentences without difficulty; no cough observed in exam room Abdominal:     General: Abdomen is flat.  Musculoskeletal:        General: Swelling, tenderness and signs of injury present. No deformity. Normal range of motion.     Cervical back: Normal range of motion and neck supple. No swelling, edema, deformity, erythema, signs of trauma, lacerations, rigidity or crepitus. No pain with movement. Normal range of motion.     Right knee: Swelling and erythema present. No ecchymosis, lacerations, bony tenderness or crepitus. Normal range of motion. Tenderness present. No medial joint line, lateral joint line, MCL, LCL, ACL or PCL tenderness. Normal alignment, normal meniscus and normal patellar mobility. Normal pulse.     Right lower leg: No deformity, lacerations or tenderness. No edema.     Left lower leg: No edema.     Right ankle: No swelling, deformity, ecchymosis or lacerations. No tenderness. Normal range of motion.     Comments: Prepatellar bursa TTP right; 0-1+/4 nonpitting edema anterior right knee; no palpable or visual swelling distal to cm from central anterior knee or ankle/feet  Lymphadenopathy:     Head:     Right side of head: No submandibular or preauricular adenopathy.     Left side of head: No submandibular or preauricular adenopathy.     Cervical: No cervical adenopathy.     Right cervical: No superficial cervical adenopathy.    Left cervical: No superficial cervical adenopathy.  Skin:    General: Skin is warm and dry.     Capillary Refill: Capillary refill takes less than 2 seconds.     Coloration: Skin is not ashen, cyanotic, jaundiced, mottled, pale or sallow.     Findings: Abrasion, abscess, erythema, signs of injury and rash present. No acne, bruising, burn, ecchymosis, laceration, lesion, petechiae or wound. Rash is macular and papular.  Rash is not crusting, nodular, purpuric, scaling, urticarial or vesicular.     Nails: There is no clubbing.       Neurological:     General: No focal deficit present.     Mental Status: She is alert and oriented to person, place, and time. Mental status is at baseline.     GCS: GCS eye subscore is 4. GCS verbal subscore is 5. GCS motor subscore is 6.     Cranial Nerves: Cranial nerves 2-12 are intact. No cranial nerve deficit, dysarthria or facial asymmetry.     Sensory: Sensation is intact. No sensory deficit.     Motor: Motor function is intact. No weakness, tremor, atrophy, abnormal muscle tone or seizure activity.     Coordination: Coordination is intact. Coordination normal.     Gait: Gait is intact. Gait normal.     Comments: In/out of chair without difficulty; gait sure and steady in clinic; bilateral hand grasp equal 5/5  Psychiatric:        Attention and Perception: Attention  and perception normal.        Mood and Affect: Mood and affect normal.        Speech: Speech normal.        Behavior: Behavior normal. Behavior is cooperative.        Thought Content: Thought content normal.        Cognition and Memory: Cognition and memory normal.        Judgment: Judgment normal.          Assessment & Plan:   A-cellulitis of right knee and seasonal allergic rhinitis  P-[employer] handouts on bursitis and skin infection printed and given to patient. Continue bactrim DS po BID  started yesterday for 7 days.  Continue salt scrubs/paste daily.  Discussed with patient if worsening erythema/pain to notify clinic staff as may need to start doxycycline also with her history of recurrent cellulitis along with hibiclens scrubs also.  Discussed if working on floor again to wear knee pads.  RTC if worsening erythema, pain, purulent discharge, fever. Wash towels, washcloths, sheets in hot water with bleach every couple of days until infection resolved. Wash area with soap and water at least daily.   Apply triple antibiotic daily to BID with dressing change.  Cover with bandage until healed over if wearing pants and avoid kneeling on right knee until healed-squat or sit on buttocks for floor work.  Given triple antibiotic ointment from clinic stock.   Patient verbalized understanding, agreed with plan of care and had no further questions at this time.    Patient may use normal saline nasal spray 2 sprays each nostril q2h wa as needed. flonase 57mcg 1 spray each nostril BID #48 RF2.  Use nasal saline first wait 5-10 minutes then blow nose then instill flonase with gentle inhale bottle straight up and down in nares not pointing towards septum.  If patient develops nosebleeds to notify me as may need to change to a different nasal steroid. Avoid triggers if possible.  Shower prior to bedtime if exposed to triggers.  If allergic dust/dust mites recommend mattress/pillow covers/encasements; washing linens, vacuuming, sweeping, dusting weekly.  Call or return to clinic as needed if these symptoms worsen or fail to improve as anticipated e.g. sinus pressure/pain, upper teeth pain, frontal/sinus headache, fever, chills, worsening mucous especially if cloudy.  Patient verbalized understanding of instructions, agreed with plan of care and had no further questions at this time.  P2:  Avoidance and hand washing.

## 2021-04-22 NOTE — Telephone Encounter (Signed)
Patient seen in warehouse stated still having some distal knee pain and feeling warm but no increase in discharge still taking bactrim DS.  Discussed if worsening swelling or pain tomorrow or new discharge to see RN Rolly Salter as will add doxycycline 100mg  po BID x 7 days.  Continue salt scrubs and washing with soap and water daily.  Patient gait sure and steady.  Nonpitting edema 0-1+/4; 1 papule center of macular erythema mild  anterior right knee.  Patient verbalized understanding information/instructions, agreed with plan of care and had no further questions at this time.

## 2021-04-23 MED ORDER — DOXYCYCLINE HYCLATE 100 MG PO TABS: 100.0000 mg | ORAL_TABLET | Freq: Two times a day (BID) | ORAL | 0 refills | Status: AC

## 2021-04-23 NOTE — Telephone Encounter (Signed)
RN Hildred Alamin saw patient today increased pain, erythema and edema anterior knee.  Patient continuing bactrim DS and salt scrubs.  Feels like tennis ball like tender area over knee per patient via telephone.  Papule is enlarging and getting more tender but denied discharge today.  Patient with history of recurrent cellulitis/abscesses sometimes requiring double antibiotics to include doxycycline.  Will start doxycycline 100mg  po BID today and re-evaluate on Monday.  Patient to call me this weekend at [phone] if worsening symptoms after 24 hours use of doxycycline.  Continue washing daily with soap and water and continuing salt scrub daily.  Patient verbalized understanding information/instructions, agreed with plan of care and had no further questions at this time.

## 2021-04-26 NOTE — Telephone Encounter (Signed)
Pt emailed RN reporting that "my knee has made major improvement since the doxy.Marland KitchenMarland KitchenSwelling is down more than 50%, still red but circumference is much smaller, warmth gone and no pain unless I kneel directly on the spot. I expect that will decrease." Asked pt to come by clinic tomorrow for visual check in but notified that it sounds like marked improvement. No further needs at this time.

## 2021-04-26 NOTE — Telephone Encounter (Signed)
Reviewed RN Haley note agreed with plan of care. 

## 2021-05-02 NOTE — Telephone Encounter (Signed)
Spoke with patient via telephone stated rash has all resolved except for small dimple/tenderness where it had been draining.  She will come to clinic Tuesday 2/28 for re-evaluation.  Discussed continue doxycycline 100mg  po BID x 10 days.  Patient reported she has continued taking doxycycline after day 7 since still had one pimple and sore spot.  Patient A&Ox3 spoke full sentences without difficulty, agreed with plan of care and had no further questions at this time.

## 2021-05-06 NOTE — Telephone Encounter (Signed)
Patient seen in workcenter right knee no erythema 1 papule remaining distal anterior knee slightly tender no fluctuance.  Patient stated kneeling still uncomfortable.  Discussed I recommended wearing knee pads if kneeling and due to still heeling sitting on buttocks recommended if long duration projects floor level.  Follow up for re-evaluation if new redness/worsening tenderness with palpation/pain at rest.  Skin well hydrated no scaling warm dry and pink capillary refill less than 2 seconds.  No visual swelling right anterior knee compared to left.  Full AROM bilateral knees.  Gait sure and steady.  Patient verbalized understanding information/instructions, agreed with plan of care and had no further questions at this time. ?

## 2021-05-20 ENCOUNTER — Other Ambulatory Visit: Payer: Self-pay | Admitting: Registered Nurse

## 2021-05-20 NOTE — Telephone Encounter (Signed)
Patient seen in workcenter denied concerns rash resolved tired from work this week and looking forward to resting this weekend. ?

## 2021-09-02 ENCOUNTER — Ambulatory Visit: Payer: Self-pay | Admitting: Medical

## 2021-09-02 ENCOUNTER — Encounter: Payer: Self-pay | Admitting: Medical

## 2021-09-02 VITALS — BP 128/88 | HR 76 | Temp 98.5°F

## 2021-09-02 DIAGNOSIS — J320 Chronic maxillary sinusitis: Secondary | ICD-10-CM

## 2021-09-02 DIAGNOSIS — N760 Acute vaginitis: Secondary | ICD-10-CM

## 2021-09-02 MED ORDER — AMOXICILLIN-POT CLAVULANATE 875-125 MG PO TABS: 1.0000 | ORAL_TABLET | Freq: Two times a day (BID) | ORAL | 0 refills | Status: DC

## 2021-09-02 MED ORDER — FLUCONAZOLE 150 MG PO TABS: 150.0000 mg | ORAL_TABLET | Freq: Every day | ORAL | 0 refills | Status: AC

## 2021-09-02 NOTE — Patient Instructions (Signed)

## 2021-09-02 NOTE — Progress Notes (Signed)
   Subjective:    Patient ID: Sydney Spencer, female    DOB: February 25, 1976, 46 y.o.   MRN: [id number]  HPI 46 yo female in non acute distress, presents with PND, sinus congestion, coughing up mucus. No fever or chills , no wheezing.Smoker. She is worried about the environmental smoke from [place].  Blood pressure 128/88, pulse 76, temperature 98.5 F (36.9 C), temperature source Tympanic, SpO2 98 %. Allergies  Allergen Reactions   Latex Itching    And rash     Review of Systems  Constitutional:  Negative for chills and fever.  HENT:  Positive for congestion, postnasal drip, sinus pressure and sinus pain. Negative for rhinorrhea.   Respiratory:  Positive for cough.        Objective:   Physical Exam Vitals and nursing note reviewed.  Constitutional:      Appearance: Normal appearance.  HENT:     Head: Normocephalic.     Right Ear: Tympanic membrane, ear canal and external ear normal.     Left Ear: Tympanic membrane, ear canal and external ear normal.     Nose: Congestion present.     Mouth/Throat:     Mouth: Mucous membranes are moist.     Pharynx: Oropharynx is clear.  Eyes:     Extraocular Movements: Extraocular movements intact.     Conjunctiva/sclera: Conjunctivae normal.     Pupils: Pupils are equal, round, and reactive to light.  Cardiovascular:     Rate and Rhythm: Normal rate and regular rhythm.     Heart sounds: Normal heart sounds.  Pulmonary:     Effort: Pulmonary effort is normal. No respiratory distress.     Breath sounds: Normal breath sounds. No stridor. No wheezing, rhonchi or rales.  Musculoskeletal:     Cervical back: Normal range of motion and neck supple.  Neurological:     General: No focal deficit present.     Mental Status: She is alert and oriented to person, place, and time.  Psychiatric:        Mood and Affect: Mood normal.        Thought Content: Thought content normal.        Judgment: Judgment normal.           Assessment & Plan:   Sinusitis maxillary To follow up in 3-5 days if symptoms are not improving.  Meds ordered this encounter  Medications   amoxicillin-clavulanate (AUGMENTIN) 875-125 MG tablet    Sig: Take 1 tablet by mouth 2 (two) times daily.    Dispense:  20 tablet    Refill:  0   Patient concerned about air quality, reassured patient, and Encouraged patient to cut back on smoking or quit. She verbalizes understanding and  has no questions at discharge.

## 2021-09-02 NOTE — Addendum Note (Signed)
Addended by: Madolin Twaddle, Herbert Seta R on: 09/02/2021 02:28 PM   Modules accepted: Orders

## 2021-09-23 ENCOUNTER — Encounter: Payer: Self-pay | Admitting: Registered Nurse

## 2021-09-23 ENCOUNTER — Ambulatory Visit: Payer: Self-pay | Admitting: Registered Nurse

## 2021-09-23 NOTE — Patient Instructions (Signed)
Preventing Skin Cancer, Adult Skin cancer is the most common type of cancer. There are three main types: Squamous cell. Basal cell. Melanoma. Squamous cell and basal cell skin cancer are the most common types. Melanoma is the most dangerous type. Most skin cancers are caused by skin damage from exposure to ultraviolet (UV) light. UV light comes from the sun and from artificial tanning beds. Suntans and sunburns result from exposure to UV light. Skin cancer occurs in people of all skin colors. Skin cancer occurs most often in older people, but it is usually the result of damage done earlier in life. The tans and sunburns you get at any age can lead to skin cancer in the future. To help prevent this, you can take steps to protect yourself. What actions can I take to protect myself from skin cancer? Many people like to get a tan, especially in the summer or when on vacation. However, tan or burned skin is a sign of skin damage and increases your risk for skin cancer. To lower your risk: Avoid exposure to UV light  Try to stay out of the sun between 10 a.m. and 4 p.m. whenever possible. This is when the sun is at its strongest. Seek the shade during this time. Remember that you can also be exposed to UV rays on cloudy or hazy days. Sun exposure can be risky year-round, not just in the summer. Do not use a sunlamp, tanning bed, or tanning booth to get a tan. If you really want a tan, use an artificial tanning lotion. Avoid getting sunburned. Sunburns are more common on bright sunny days, especially when you are in areas where the sun is reflected off water or snow. Use sunscreen and protective clothing  Always use sunscreen--either a cream, lotion, or spray--when you are out in the sun. Keep sunscreen handy, such as in your gym bag or in your car, so that you will have it when you need it. Use sunscreen with a sun protection factor (SPF) of 30 or higher. Make sure your sunscreen protects you from UVA  and UVB light. It should also be water-resistant. Use enough sunscreen to cover all exposed areas of your skin. Put it on 15-30 minutes before you go out. Reapply sunscreen every 2 hours or anytime you come out of the water. When you are out in the sun, wear a broad-brimmed hat, clothing that covers your arms and legs, and wraparound sunglasses. Protect your lips by wearing a lip balm or lip stick with an SPF of at least 30. Check your skin for changes Check your skin often from head to toe to look for any changes in the size, color, or shape of any moles or freckles. Check for any new moles or moles that bleed or become itchy. See your health care provider if you notice any changes. Ask your health care provider about a total skin check. Ask if it should be part of your yearly physical or if you need to see a skin specialist (dermatologist). Take other preventive measures  Avoid exposure to harmful chemicals, such as arsenic. You can do this by: Having your home's water tested for arsenic and other chemicals. Taking protective measures to avoid exposure to chemicals at work. Do not use any products that contain nicotine or tobacco. These products include cigarettes, chewing tobacco, and vaping devices, such as e-cigarettes. If you need help quitting, ask your health care provider. Keep your immune system healthy. Take steps, such as: Staying up to   date on all vaccines, including the human papillomavirus (HPV) vaccine. Eating at least 5 servings of fruits and vegetables every day. Why are these changes important? About 1 of every 5 people will get skin cancer. The best way to reduce your risk is to avoid skin damage from UV light. If you have teenagers in your house, they should know that just five bad sunburns as a teen could double their risk of skin cancer in the future. If you have younger children, always make sure to protect their skin from the sun. These changes can help reduce your risk of  skin cancer. They will also provide other health benefits, such as: Protecting your skin from the sun can help prevent painful sunburns, sun poisoning, and other skin damage and blemishes. This is especially important if: You have pale white skin, freckles, and red hair. You burn easily. Avoiding exposure to harmful chemicals can help prevent damage to other tissues in your body, such as your lungs, and prevent other types of cancer. Avoiding smoking tobacco can reduce your risk for other types of cancer and other health problems. Eating a healthy diet is good for your overall health. What can happen if changes are not made? If you do not make these changes, you will be at higher risk for skin cancer. If you develop skin cancer, the treatments could result in lost time from work and changes in your appearance from scars. The most dangerous type of skin cancer, melanoma, can be deadly if not found early. Where to find support For more support, talk to your health care provider or dermatologist. Where to find more information Learn more about skin cancer from: The Skin Cancer Foundation: www.skincancer.org/prevention The Centers for Disease Control and Prevention: www.cdc.gov/cancer/skin/ The American Academy of Dermatology: www.aad.org Summary Skin cancer is the most common type of cancer. Melanoma skin cancer can be deadly if not found early. Sunburns and tanning increase your risk for skin cancer. Protecting your skin from UV light is the best way to prevent skin cancer. This information is not intended to replace advice given to you by your health care provider. Make sure you discuss any questions you have with your health care provider. Document Revised: 08/18/2020 Document Reviewed: 08/18/2020 Elsevier Patient Education  2023 [employer]  

## 2021-09-23 NOTE — Progress Notes (Signed)
Subjective:    Patient ID: Sydney Spencer, female    DOB: 1976/01/25, 46 y.o.   MRN: [id number]  46y/o caucasian single female established patient here with concern lesion right cheek face.  "Irritated has been changing in color and enlarging over the past couple months and when she touched it in the past week top layer of skin cells sloughed off"  Denied bleeding/discharge/fever/chills.  Does not feel like infected hair follicle/boil either.  Patient is established with dermatology but has not had visit in 5 years last full body check.      Review of Systems  Constitutional:  Negative for diaphoresis, fatigue, fever and unexpected weight change.  HENT:  Negative for trouble swallowing and voice change.   Eyes:  Negative for photophobia and visual disturbance.  Respiratory:  Negative for cough, choking, shortness of breath, wheezing and stridor.   Cardiovascular:  Negative for chest pain.  Gastrointestinal:  Negative for diarrhea, nausea and vomiting.  Endocrine: Negative for cold intolerance and heat intolerance.  Musculoskeletal:  Negative for gait problem, neck pain and neck stiffness.  Skin:  Positive for color change and rash. Negative for pallor and wound.  Allergic/Immunologic: Negative for food allergies.  Neurological:  Negative for dizziness, tremors, seizures, syncope, facial asymmetry, speech difficulty, weakness, light-headedness, numbness and headaches.  Hematological:  Negative for adenopathy. Does not bruise/bleed easily.  Psychiatric/Behavioral:  Negative for agitation, confusion and sleep disturbance.        Objective:   Physical Exam Vitals and nursing note reviewed.  Constitutional:      General: She is awake. She is not in acute distress.    Appearance: Normal appearance. She is well-developed, well-groomed and normal weight. She is not ill-appearing, toxic-appearing or diaphoretic.  HENT:     Head: Normocephalic and atraumatic.     Jaw: There is normal jaw  occlusion.     Salivary Glands: Right salivary gland is not diffusely enlarged or tender. Left salivary gland is not diffusely enlarged or tender.     Right Ear: Hearing and external ear normal.     Left Ear: Hearing and external ear normal.     Nose: Nose normal. No congestion or rhinorrhea.     Mouth/Throat:     Lips: Pink. No lesions.     Mouth: Mucous membranes are moist.     Pharynx: Oropharynx is clear.  Eyes:     General: Lids are normal. Vision grossly intact. Gaze aligned appropriately. Allergic shiner present. No scleral icterus.       Right eye: No discharge.        Left eye: No discharge.     Extraocular Movements: Extraocular movements intact.     Right eye: Normal extraocular motion and no nystagmus.     Left eye: Normal extraocular motion and no nystagmus.     Conjunctiva/sclera: Conjunctivae normal.     Pupils: Pupils are equal, round, and reactive to light.  Neck:     Trachea: Trachea and phonation normal. No tracheal deviation.  Cardiovascular:     Rate and Rhythm: Normal rate and regular rhythm.  Pulmonary:     Effort: Pulmonary effort is normal.     Breath sounds: Normal breath sounds and air entry. No stridor or transmitted upper airway sounds. No wheezing.     Comments: Spoke full sentences without difficulty; no cough observed in clinic Abdominal:     General: Abdomen is flat.  Musculoskeletal:        General: Normal range of motion.  Cervical back: Normal range of motion and neck supple. No edema, erythema or rigidity. No pain with movement.  Lymphadenopathy:     Head:     Right side of head: No submandibular or preauricular adenopathy.     Left side of head: No submandibular or preauricular adenopathy.     Cervical: No cervical adenopathy.     Right cervical: No superficial cervical adenopathy.    Left cervical: No superficial cervical adenopathy.  Skin:    General: Skin is warm and dry.     Capillary Refill: Capillary refill takes less than 2  seconds.     Coloration: Skin is not ashen, cyanotic, jaundiced, mottled, pale or sallow.     Findings: Erythema, lesion and rash present. No abrasion, abscess, acne, bruising, burn, ecchymosis, signs of injury, laceration, petechiae or wound. Rash is macular. Rash is not crusting, nodular, papular, purpuric, pustular, scaling, urticarial or vesicular.          Comments: Nummular hyperpigmented macule right cheek facial dry excoriated capillary vessels noted  Neurological:     General: No focal deficit present.     Mental Status: She is alert and oriented to person, place, and time. Mental status is at baseline.     Cranial Nerves: No cranial nerve deficit.     Sensory: No sensory deficit.     Motor: Motor function is intact. No weakness, tremor, abnormal muscle tone or seizure activity.     Coordination: Coordination is intact. Coordination normal.     Gait: Gait is intact. Gait normal.     Comments: In/out of chair without difficulty; gait sure and steady in clinic; bilateral hand grasp equal 5/5  Psychiatric:        Attention and Perception: Attention and perception normal.        Mood and Affect: Mood and affect normal.        Speech: Speech normal.        Behavior: Behavior normal. Behavior is cooperative.        Thought Content: Thought content normal.        Cognition and Memory: Cognition and memory normal.        Judgment: Judgment normal.           Assessment & Plan:   A-facial skin lesion  P-Recommend dermatology consult/consider biopsy.  Biopsy services not available in this clinic per contract constraints.  Patient going to contact her dermatologist-last visit 5 years ago.  Patient to notify me if new referral required by office and will send.  ABCDEs discussed monitor moles for changes especially if bleeding, not healing, enlarging quickly follow up re-evaluation with a provider and consider punch biopsy/freezing.  Exitcare handout on preventing skin cancer.    Discussed monitor for Asymmetrical, Border irregular, Changing color, Diameter enlarging especially greater than 1cm, Excoriation/Evolving lesion when performing self-skin exams. Continue self-skin exams on regular basis, wear protective clothing and sunscreen at least 30 SPF. Follow up with dermatology if any of above symptoms noted and I recommend biopsy. Patient verbalized understanding of instructions, agreed with plan of care and had no further questions at this time.

## 2021-11-12 ENCOUNTER — Ambulatory Visit: Payer: No Typology Code available for payment source

## 2021-11-12 DIAGNOSIS — T148XXA Other injury of unspecified body region, initial encounter: Secondary | ICD-10-CM

## 2021-11-12 NOTE — Patient Instructions (Signed)
Pt states she got puncture wound on R 2nd toe [place].  Pt states she cleaned with Peroxide and alcohol initially.  Slight redness and swelling noted.  Instructed to keep clean with soap and water. Keep clean and dry.  Apply triple antibiotic ointment and keep covered.  Watch for signs of infection including increased redness, drainage, swelling and increased tenderness to touch.  Pt verbalized understanding.

## 2021-11-15 ENCOUNTER — Ambulatory Visit: Payer: No Typology Code available for payment source

## 2021-11-15 DIAGNOSIS — N390 Urinary tract infection, site not specified: Secondary | ICD-10-CM

## 2021-11-15 LAB — POCT URINALYSIS DIPSTICK
Bilirubin, UA: NEGATIVE
Blood, UA: NEGATIVE
Glucose, UA: NEGATIVE
Ketones, UA: NEGATIVE
Nitrite, UA: NEGATIVE
Protein, UA: NEGATIVE
Spec Grav, UA: 1.02 (ref 1.010–1.025)
Urobilinogen, UA: 0.2 E.U./dL
pH, UA: 6.5 (ref 5.0–8.0)

## 2021-11-16 ENCOUNTER — Ambulatory Visit: Payer: No Typology Code available for payment source | Admitting: Registered Nurse

## 2021-11-16 ENCOUNTER — Encounter: Payer: Self-pay | Admitting: Registered Nurse

## 2021-11-16 ENCOUNTER — Other Ambulatory Visit: Payer: Self-pay | Admitting: Registered Nurse

## 2021-11-16 VITALS — BP 128/90 | HR 66 | Temp 98.2°F | Resp 16

## 2021-11-16 DIAGNOSIS — L309 Dermatitis, unspecified: Secondary | ICD-10-CM

## 2021-11-16 DIAGNOSIS — R1032 Left lower quadrant pain: Secondary | ICD-10-CM | POA: Insufficient documentation

## 2021-11-16 DIAGNOSIS — R1111 Vomiting without nausea: Secondary | ICD-10-CM | POA: Insufficient documentation

## 2021-11-16 DIAGNOSIS — R197 Diarrhea, unspecified: Secondary | ICD-10-CM | POA: Insufficient documentation

## 2021-11-16 DIAGNOSIS — R634 Abnormal weight loss: Secondary | ICD-10-CM | POA: Insufficient documentation

## 2021-11-16 DIAGNOSIS — J00 Acute nasopharyngitis [common cold]: Secondary | ICD-10-CM

## 2021-11-16 DIAGNOSIS — R300 Dysuria: Secondary | ICD-10-CM

## 2021-11-16 MED ORDER — AMOXICILLIN-POT CLAVULANATE 875-125 MG PO TABS: 1.0000 | ORAL_TABLET | Freq: Two times a day (BID) | ORAL | 0 refills | Status: AC

## 2021-11-16 MED ORDER — MUPIROCIN 2 % EX OINT: 1.0000 | TOPICAL_OINTMENT | Freq: Two times a day (BID) | CUTANEOUS | 0 refills | Status: DC

## 2021-11-16 MED ORDER — PHENAZOPYRIDINE HCL 200 MG PO TABS: 200.0000 mg | ORAL_TABLET | Freq: Three times a day (TID) | ORAL | 0 refills | Status: AC

## 2021-11-16 NOTE — Patient Instructions (Addendum)
Rash, Adult A rash is a change in the color of your skin. A rash can also change the way your skin feels. There are many different conditions and factors that can cause a rash. Some rashes may disappear after a few days, but some may last for a few weeks. Common causes of rashes include: Viral infections, such as: Colds. Measles. Hand, foot, and mouth disease. Bacterial infections, such as: Scarlet fever. Impetigo. Fungal infections, such as Candida. Allergic reactions to food, medicines, or skin care products. Follow these instructions at home: The goal of treatment is to stop the itching and keep the rash from spreading. Pay attention to any changes in your symptoms. Follow these instructions to help with your condition: Medicine Take or apply over-the-counter and prescription medicines only as told by your health care provider. These may include: Corticosteroid creams to treat red or swollen skin. Anti-itch lotions. Oral allergy medicines (antihistamines). Oral corticosteroids for severe symptoms.  Skin care Apply cool compresses to the affected areas. Do not scratch or rub your skin. Avoid covering the rash. Make sure the rash is exposed to air as much as possible. Managing itching and discomfort Avoid hot showers or baths, which can make itching worse. A cold shower may help. Try taking a bath with: Epsom salts. Follow manufacturer instructions on the packaging. You can get these at your local pharmacy or grocery store. Baking soda. Pour a small amount into the bath as told by your health care provider. Colloidal oatmeal. Follow manufacturer instructions on the packaging. You can get this at your local pharmacy or grocery store. Try applying baking soda paste to your skin. Stir water into baking soda until it reaches a paste-like consistency. Try applying calamine lotion. This is an over-the-counter lotion that helps to relieve itchiness. Keep cool and out of the sun. Sweating  and being hot can make itching worse. General instructions  Rest as needed. Drink enough fluid to keep your urine pale yellow. Wear loose-fitting clothing. Avoid scented soaps, detergents, and perfumes. Use gentle soaps, detergents, perfumes, and other cosmetic products. Avoid any substance that causes your rash. Keep a journal to help track what causes your rash. Write down: What you eat. What cosmetic products you use. What you drink. What you wear. This includes jewelry. Keep all follow-up visits as told by your health care provider. This is important. Contact a health care provider if: You sweat at night. You lose weight. You urinate more than normal. You urinate less than normal, or you notice that your urine is a darker color than usual. You feel weak. You vomit. Your skin or the whites of your eyes look yellow (jaundice). Your skin: Tingles. Is numb. Your rash: Does not go away after several days. Gets worse. You are: Unusually thirsty. More tired than normal. You have: New symptoms. Pain in your abdomen. A fever. Diarrhea. Get help right away if you: Have a fever and your symptoms suddenly get worse. Develop confusion. Have a severe headache or a stiff neck. Have severe joint pains or stiffness. Have a seizure. Develop a rash that covers all or most of your body. The rash may or may not be painful. Develop blisters that: Are on top of the rash. Grow larger or grow together. Are painful. Are inside your nose or mouth. Develop a rash that: Looks like purple pinprick-sized spots all over your body. Has a "bull's eye" or looks like a target. Is not related to sun exposure, is red and painful, and causes   your skin to peel. Summary A rash is a change in the color of your skin. Some rashes disappear after a few days, but some may last for a few weeks. The goal of treatment is to stop the itching and keep the rash from spreading. Take or apply over-the-counter  and prescription medicines only as told by your health care provider. Contact a health care provider if you have new or worsening symptoms. Keep all follow-up visits as told by your health care provider. This is important. This information is not intended to replace advice given to you by your health care provider. Make sure you discuss any questions you have with your health care provider. Document Revised: 12/03/2020 Document Reviewed: 12/03/2020 [employer] Patient Education  2023 [employer] Dysuria Dysuria is pain or discomfort during urination. The pain or discomfort may be felt in the part of the body that drains urine from the bladder (urethra) or in the surrounding tissue of the genitals. The pain may also be felt in the groin area, lower abdomen, or lower back. You may have to urinate frequently or have the sudden feeling that you have to urinate (urgency). Dysuria can affect anyone, but it is more common in females. Dysuria can be caused by many different things, including: Urinary tract infection. Kidney stones or bladder stones. Certain STIs (sexually transmitted infections), such as chlamydia. Dehydration. Inflammation of the tissues of the vagina. Use of certain medicines. Use of certain soaps or scented products that cause irritation. Follow these instructions at home: Medicines Take over-the-counter and prescription medicines only as told by your health care provider. If you were prescribed an antibiotic medicine, take it as told by your health care provider. Do not stop taking the antibiotic even if you start to feel better. Eating and drinking  Drink enough fluid to keep your urine pale yellow. Avoid caffeinated beverages, tea, and alcohol. These beverages can irritate the bladder and make dysuria worse. In males, alcohol may irritate the prostate. General instructions Watch your condition for any changes. Urinate often. Avoid holding urine for long periods of time. If  you are female, you should wipe from front to back after urinating or having a bowel movement. Use each piece of toilet paper only once. Empty your bladder after sex. Keep all follow-up visits. This is important. If you had any tests done to find the cause of dysuria, it is up to you to get your test results. Ask your health care provider, or the department that is doing the test, when your results will be ready. Contact a health care provider if: You have a fever. You develop pain in your back or sides. You have nausea or vomiting. You have blood in your urine. You are not urinating as often as you usually do. Get help right away if: Your pain is severe and not relieved with medicines. You cannot eat or drink without vomiting. You are confused. You have a rapid heartbeat while resting. You have shaking or chills. You feel extremely weak. Summary Dysuria is pain or discomfort while urinating. Many different conditions can lead to dysuria. If you have dysuria, you may have to urinate frequently or have the sudden feeling that you have to urinate (urgency). Watch your condition for any changes. Keep all follow-up visits. Make sure that you urinate often and drink enough fluid to keep your urine pale yellow. This information is not intended to replace advice given to you by your health care provider. Make sure you discuss any  questions you have with your health care provider. Document Revised: 10/04/2019 Document Reviewed: 10/04/2019 Else[employer] Patient Education  2023 [employer] Cellulitis, Adult  Cellulitis is a skin infection. The infected area is usually warm, red, swollen, and tender. This condition occurs most often in the arms and lower legs. The infection can travel to the muscles, blood, and underlying tissue and become serious. It is very important to get treated for this condition. What are the causes? Cellulitis is caused by bacteria. The bacteria enter through a break in the  skin, such as a cut, burn, insect bite, open sore, or crack. What increases the risk? This condition is more likely to occur in people who: Have a weak body defense system (immune system). Have open wounds on the skin, such as cuts, burns, bites, and scrapes. Bacteria can enter the body through these open wounds. Are older than 48 years of age. Have diabetes. Have a type of long-lasting (chronic) liver disease (cirrhosis) or kidney disease. Are obese. Have a skin condition such as: Itchy rash (eczema). Slow movement of blood in the veins (venous stasis). Fluid buildup below the skin (edema). Have had radiation therapy. Use IV drugs. What are the signs or symptoms? Symptoms of this condition include: Redness, streaking, or spotting on the skin. Swollen area of the skin. Tenderness or pain when an area of the skin is touched. Warm skin. A fever. Chills. Blisters. How is this diagnosed? This condition is diagnosed based on a medical history and physical exam. You may also have tests, including: Blood tests. Imaging tests. How is this treated? Treatment for this condition may include: Medicines, such as antibiotic medicines or medicines to treat allergies (antihistamines). Supportive care, such as rest and application of cold or warm cloths (compresses) to the skin. Hospital care, if the condition is severe. The infection usually starts to get better within 1-2 days of treatment. Follow these instructions at home:  Medicines Take over-the-counter and prescription medicines only as told by your health care provider. If you were prescribed an antibiotic medicine, take it as told by your health care provider. Do not stop taking the antibiotic even if you start to feel better. General instructions Drink enough fluid to keep your urine pale yellow. Do not touch or rub the infected area. Raise (elevate) the infected area above the level of your heart while you are sitting or lying  down. Apply warm or cold compresses to the affected area as told by your health care provider. Keep all follow-up visits as told by your health care provider. This is important. These visits let your health care provider make sure a more serious infection is not developing. Contact a health care provider if: You have a fever. Your symptoms do not begin to improve within 1-2 days of starting treatment. Your bone or joint underneath the infected area becomes painful after the skin has healed. Your infection returns in the same area or another area. You notice a swollen bump in the infected area. You develop new symptoms. You have a general ill feeling (malaise) with muscle aches and pains. Get help right away if: Your symptoms get worse. You feel very sleepy. You develop vomiting or diarrhea that persists. You notice red streaks coming from the infected area. Your red area gets larger or turns dark in color. These symptoms may represent a serious problem that is an emergency. Do not wait to see if the symptoms will go away. Get medical help right away. Call your local emergency  services (911 in the [place] Do not drive yourself to the hospital. Summary Cellulitis is a skin infection. This condition occurs most often in the arms and lower legs. Treatment for this condition may include medicines, such as antibiotic medicines or antihistamines. Take over-the-counter and prescription medicines only as told by your health care provider. If you were prescribed an antibiotic medicine, do not stop taking the antibiotic even if you start to feel better. Contact a health care provider if your symptoms do not begin to improve within 1-2 days of starting treatment or your symptoms get worse. Keep all follow-up visits as told by your health care provider. This is important. These visits let your health care provider make sure that a more serious infection is not developing. This information is not intended  to replace advice given to you by your health care provider. Make sure you discuss any questions you have with your health care provider. Document Revised: 12/02/2020 Document Reviewed: 12/03/2020 Else[employer] Patient Education  2023 [employer].

## 2021-11-16 NOTE — Progress Notes (Signed)
Subjective:    Patient ID: Sydney Spencer, female    DOB: 1975-04-12, 46 y.o.   MRN: [id number]  46y/o caucasian female with burning with urination after visiting boyfriend.  Denied fever/chills/back pain/headache/cloudy or smelly urine/perineal rash/vaginal discharge.  Saw RN Stone yesterday and had dipstick urinalysis positive leukocytes.  Has been on clindamycin for skin infection left lower abdomen along with mupirocin ointment and salt scrubs to affected area daily after urgent care visit.  Ran out of hibiclens previously treated with doxycycline po prn from [facility] but recently had to switch PCMs when her established moved from area.  She thinks I&D needed as can feel a small tract under skin again but her dermatology office booked out a few months.  She has also had a round of bactrim and doxycycline without resolution in the past four months.  Patient unsure if wound culture has been performed in the past.   Denied discharge or worsening of symptoms last office visit for rash [facility] - [address] [place], [place], [place] Patient has diflucan at home for prn use after antibiotics common for her to have vaginal yeast infection.  Denied symptoms currently.  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  Has noticed increased nasal congestion not wearing mask while cleaning affected area for mold at work.  Would like claritin/masks from clinic stock for use today.  Has nasal saline at home.      Review of Systems  Constitutional:  Negative for activity change, appetite change, chills, diaphoresis, fatigue and fever.  HENT:  Positive for congestion, postnasal drip and rhinorrhea. Negative for sinus pressure, sinus pain, sore throat, trouble swallowing and voice change.   Eyes:  Negative for photophobia and visual disturbance.  Respiratory:  Negative for cough, shortness of breath, wheezing and stridor.    Cardiovascular:  Negative for leg swelling.  Gastrointestinal:  Negative for diarrhea, nausea and vomiting.  Endocrine: Negative for cold intolerance and heat intolerance.  Genitourinary:  Positive for dysuria. Negative for difficulty urinating, dyspareunia, flank pain, frequency, genital sores, hematuria, menstrual problem, pelvic pain, urgency, vaginal bleeding, vaginal discharge and vaginal pain.  Musculoskeletal:  Negative for gait problem, neck pain and neck stiffness.  Skin:  Positive for rash.  Allergic/Immunologic: Positive for environmental allergies. Negative for food allergies.  Neurological:  Positive for headaches. Negative for dizziness, tremors, seizures, syncope, facial asymmetry, speech difficulty, weakness, light-headedness and numbness.  Hematological:  Negative for adenopathy. Does not bruise/bleed easily.  Psychiatric/Behavioral:  Negative for agitation, confusion and sleep disturbance.        Objective:   Physical Exam Vitals and nursing note reviewed.  Constitutional:      General: She is awake. She is not in acute distress.    Appearance: Normal appearance. She is well-developed, well-groomed and normal weight. She is not ill-appearing, toxic-appearing or diaphoretic.  HENT:     Head: Normocephalic and atraumatic.     Jaw: There is normal jaw occlusion.     Salivary Glands: Right salivary gland is not diffusely enlarged or tender. Left salivary gland is not diffusely enlarged or tender.     Right Ear: Hearing, ear canal and external ear normal.     Left Ear: Hearing, ear canal and external ear normal.     Nose: Nose normal. No congestion or rhinorrhea.     Right Turbinates: Enlarged and swollen. Not pale.     Left Turbinates: Enlarged and swollen.  Not pale.     Right Sinus: No maxillary sinus tenderness or frontal sinus tenderness.     Left Sinus: No maxillary sinus tenderness or frontal sinus tenderness.     Mouth/Throat:     Lips: Pink. No lesions.      Mouth: Mucous membranes are moist. No oral lesions or angioedema.     Dentition: No gum lesions.     Tongue: No lesions. Tongue does not deviate from midline.     Palate: No mass and lesions.     Pharynx: Uvula midline. Pharyngeal swelling and posterior oropharyngeal erythema present. No oropharyngeal exudate or uvula swelling.     Tonsils: No tonsillar exudate or tonsillar abscesses. 0 on the right. 0 on the left.     Comments: Cobblestoning posterior pharynx; bilateral allergic shiners; nasal sniffing in clinic/congestion/postnasal drip Eyes:     General: Lids are normal. Vision grossly intact. Gaze aligned appropriately. Allergic shiner present. No scleral icterus.       Right eye: No discharge.        Left eye: No discharge.     Extraocular Movements: Extraocular movements intact.     Conjunctiva/sclera: Conjunctivae normal.     Pupils: Pupils are equal, round, and reactive to light.  Neck:     Trachea: Trachea and phonation normal. No tracheal deviation.  Cardiovascular:     Rate and Rhythm: Normal rate and regular rhythm.     Pulses: Normal pulses.          Radial pulses are 2+ on the right side and 2+ on the left side.     Heart sounds: Normal heart sounds, S1 normal and S2 normal. No murmur heard.    No friction rub. No gallop.  Pulmonary:     Effort: Pulmonary effort is normal. No respiratory distress.     Breath sounds: Normal breath sounds and air entry. No stridor, decreased air movement or transmitted upper airway sounds. No decreased breath sounds, wheezing, rhonchi or rales.     Comments: Spoke full sentences without difficulty; no cough observed in exam room; occasional throat clearing noted Abdominal:     General: Abdomen is flat. Bowel sounds are decreased. There is no distension.     Palpations: Abdomen is soft. There is no fluid wave or pulsatile mass.     Tenderness: There is abdominal tenderness in the left lower quadrant. There is no right CVA tenderness, left CVA  tenderness, guarding or rebound.     Hernia: No hernia is present. There is no hernia in the umbilical area, ventral area, left inguinal area or left femoral area.       Comments: Dull to percussion x 4 quads; hypoactive bowel sounds x 4 quads; LLQ mildly TTP over nummular scaly rash 1.5cm diameter  Musculoskeletal:        General: No signs of injury. Normal range of motion.     Right hand: No lacerations. Normal strength. Normal capillary refill.     Left hand: No lacerations. Normal strength. Normal capillary refill.     Cervical back: Normal range of motion and neck supple. No swelling, edema, deformity, erythema, signs of trauma, lacerations, rigidity, torticollis, tenderness or crepitus. No pain with movement. Normal range of motion.     Thoracic back: No swelling, edema, deformity, signs of trauma, lacerations or tenderness. Normal range of motion.     Lumbar back: No swelling, edema, deformity, signs of trauma, lacerations or tenderness. Normal range of motion.     Right lower  leg: No edema.     Left lower leg: No edema.  Lymphadenopathy:     Head:     Right side of head: No submandibular or preauricular adenopathy.     Left side of head: No submandibular or preauricular adenopathy.     Cervical: No cervical adenopathy.     Right cervical: No superficial cervical adenopathy.    Left cervical: No superficial cervical adenopathy.  Skin:    General: Skin is warm and dry.     Capillary Refill: Capillary refill takes less than 2 seconds.     Coloration: Skin is not ashen, cyanotic, jaundiced, mottled, pale or sallow.     Findings: Rash present. No abrasion, bruising, burn, erythema, signs of injury, laceration, lesion, petechiae or wound. Rash is macular, papular and scaling. Rash is not crusting, nodular, purpuric, pustular, urticarial or vesicular.     Nails: There is no clubbing.          Comments: Nummular scaling (gross) macular papular diameter 1.5cm grouped dry lesion; no  palpable fluctuance increased temperature compared to surrounding skin; jean shorts rubbing affected area at waistband slightly TTP  Neurological:     General: No focal deficit present.     Mental Status: She is alert and oriented to person, place, and time. Mental status is at baseline.     GCS: GCS eye subscore is 4. GCS verbal subscore is 5. GCS motor subscore is 6.     Cranial Nerves: Cranial nerves 2-12 are intact. No cranial nerve deficit, dysarthria or facial asymmetry.     Sensory: Sensation is intact.     Motor: Motor function is intact. No weakness, tremor, atrophy, abnormal muscle tone or seizure activity.     Coordination: Coordination is intact. Coordination normal.     Gait: Gait is intact. Gait normal.     Comments: In/out of chair and on/off exam table without difficulty; gait sure and steady in clinic; bilateral hand grasp equal 5/5  Psychiatric:        Attention and Perception: Attention and perception normal.        Mood and Affect: Mood and affect normal.        Speech: Speech normal.        Behavior: Behavior normal. Behavior is cooperative.        Thought Content: Thought content normal.        Cognition and Memory: Cognition and memory normal.        Judgment: Judgment normal.           Assessment & Plan:   A-dermatitis abdomen subsequent visit, dysuria, acute rhinitis  P-continue mupirocin 2% topical BID until area healed.  Patient has completed doxycycline/bactrim/clindamycin in the previous 6 months on epic review.  Had stopped hibiclens scrub and only performing salt paste daily now along with shower soap and water daily.  Discussed to restart hibiclens as helped in the past along with augmentin/doxycycline course.  Augmentin 875mg  po BID x 7 days #20 RF0 dispensed from [employer] to patient to cover for MRSA start if no improvement pain/lesion with telfa/mupirocin/keeping covered 48 hours while at work. Along with mupirocin topical.  Reviewed Epic with patient no  cultures wound found on care everywhere.  Discuss with her provider to perform if I&D since history of lesions difficult to resolve/requiring multiple antibiotic courses.  She has diflucan 150mg  tabs at home available if vaginal yeast infection after antibiotic/history of occurrence after augmentin.  Tylenol 1000mg  po q6h prn pain and/or ibuprofen  800mg  po q8h prn pain take with food.   Lesion today mildly tender but may be due from friction irritation healing skin also. Dry.  Papules abraded tips/scabbed.  Discussed I&D not available at this clinic no lidocaine and procedures invasive not part of contract coverage/available this site.  Keep covered at work with telfa/aquaphor or mupirocin.  Avoid scratching.  Keep scheduled appt with dermatology notify clinic staff if worsening/see her established UC provider if [facility] closed e.g. Wed/Sat/Sun.  Exitcare handout dermatitis/celluiltis.  Patient verbalized understanding information/instructions, agreed with plan of care and had no further questions at this time.  Discussed most likely manual irritation from visit with boyfriend. Discussed foods that can irritate bladder e.g. cruciferous vegetables (asparagus/cauliflower/broccoli), processed/smoked meats, carbonated beverages, alcohol, caffeine, chocolate, artificial sweeteners, acidic/citrus fruits and/or tomatoes.   POCT urinalysis yesterday unremarkable.  Clean catch urine culture sent to [facility] today results typically available 24-72 hours.  Medications as directed. Patient is also to push fluids and may use Pyridium 200mg  po TID as needed #12 RF0 electronic Rx sent to her pharmacy of choice. Discussed augmentin dispensed to start for possible cellulitis also covers for UTI.  Hydrate, avoid dehydration. Avoid holding urine void on frequent basis every 4 to 6 hours. If unable to void every 8 hours follow up for re-evaluation with PCM, urgent care or ER. Call or return to clinic as needed if these  symptoms worsen or fail to improve as anticipated. Exitcare handout on dysuria.  Patient verbalized agreement and understanding of treatment plan and had no further questions at this time.   Given surgical masks from clinic and loratadine 10mg  po daily 2 UD from clinic stock.  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  Nasal saline 2 sprays each nostril q2h wa prn congestion may obtain from clinic stock.  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 History of environmental allergies.  Denied sick contacts.  Consider covid testing if fever/chills/sore throat/body aches/loss taste/smell as circulating in community increased admissions/ER visits over the past month/waste water testing numbers increasing over the past 4 weeks.   Patient returned to clinic later in the afternoon and congestion/rhinitis improved after loratadine had time to take effect no further audible sniffing/congestion nasal.  Patient verbalized understanding information/instructions, agreed with plan of care and had no further questions at this time. P2: Hydrate and cranberry juice

## 2021-11-18 LAB — URINE CULTURE

## 2021-11-26 IMAGING — MG DIGITAL DIAGNOSTIC BILAT W/ TOMO W/ CAD
8 series · 9 of 24 positions shown · non-contrast
Comparison: Previous exam(s).

CLINICAL DATA: Patient states that she had a infected sebaceous
cyst in the left axilla that has resolved. She has no current
problems.

EXAM:
DIGITAL DIAGNOSTIC BILATERAL MAMMOGRAM WITH CAD AND TOMO

[L CC synth-2D]
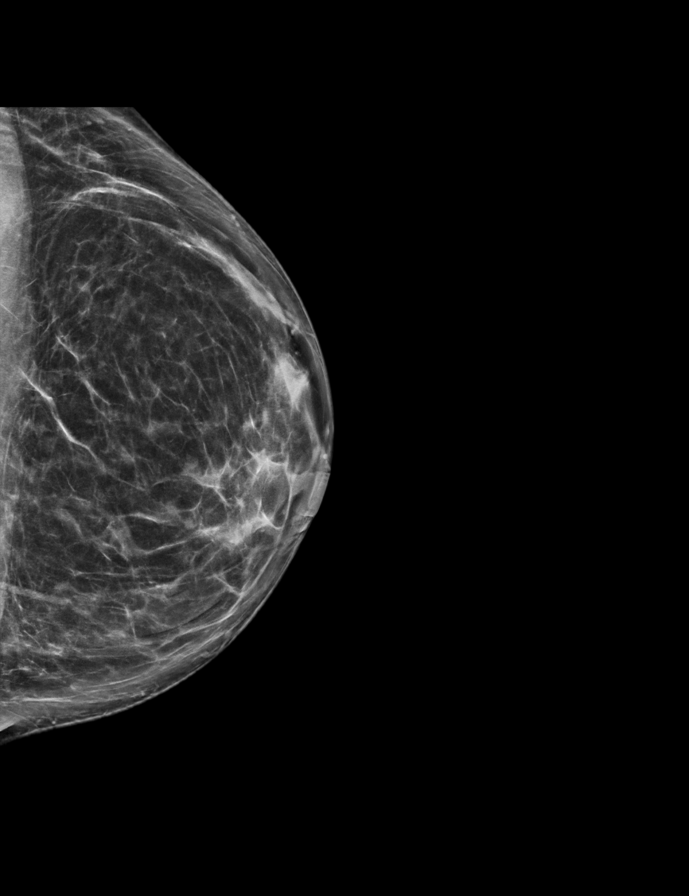

[L MLO synth-2D]
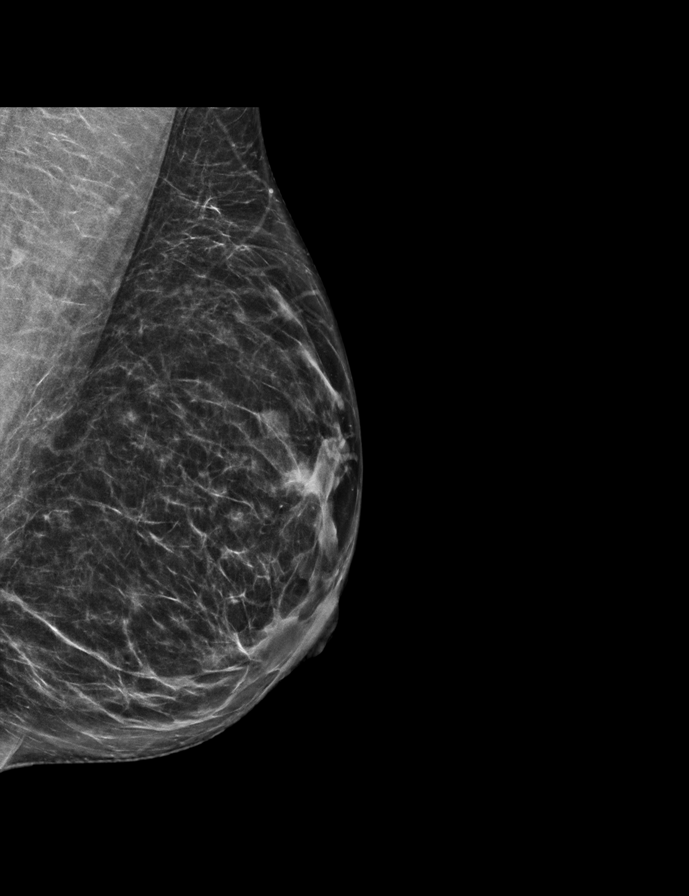

[R MLO synth-2D]
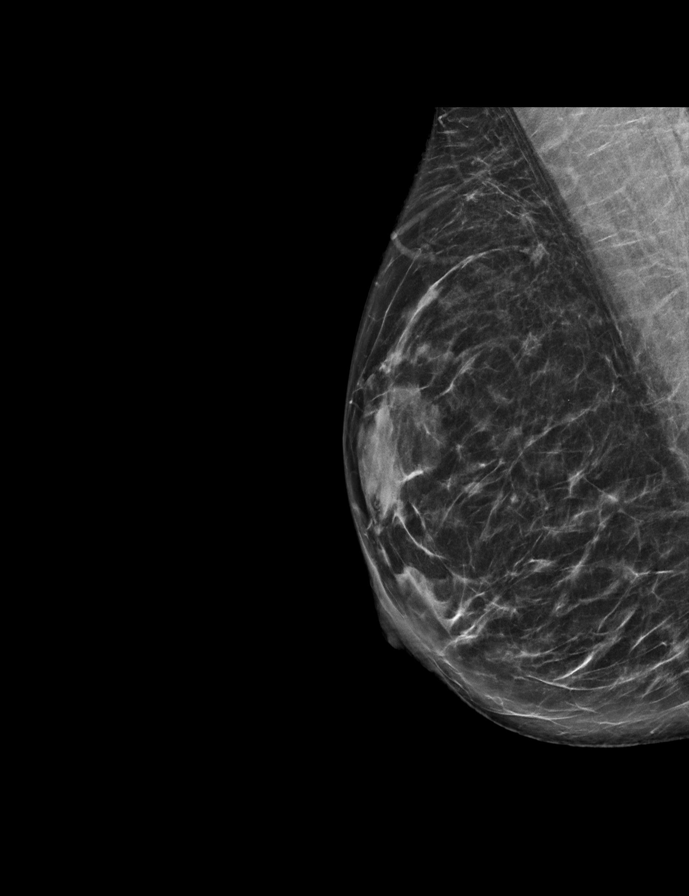

[R CC synth-2D]
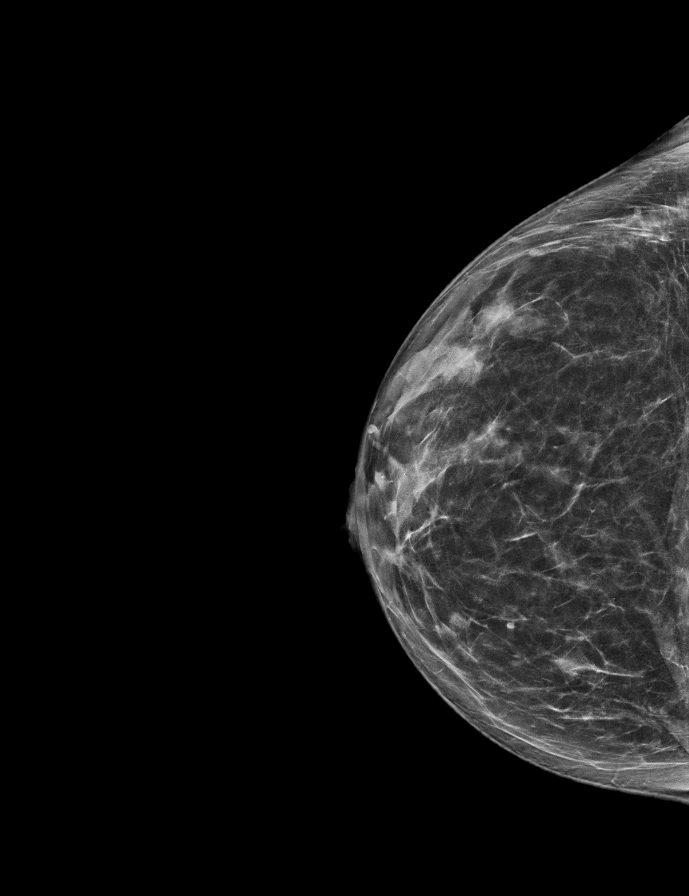

[R MLO tomo · 2 of 66 frames shown]
[frame 22/66]
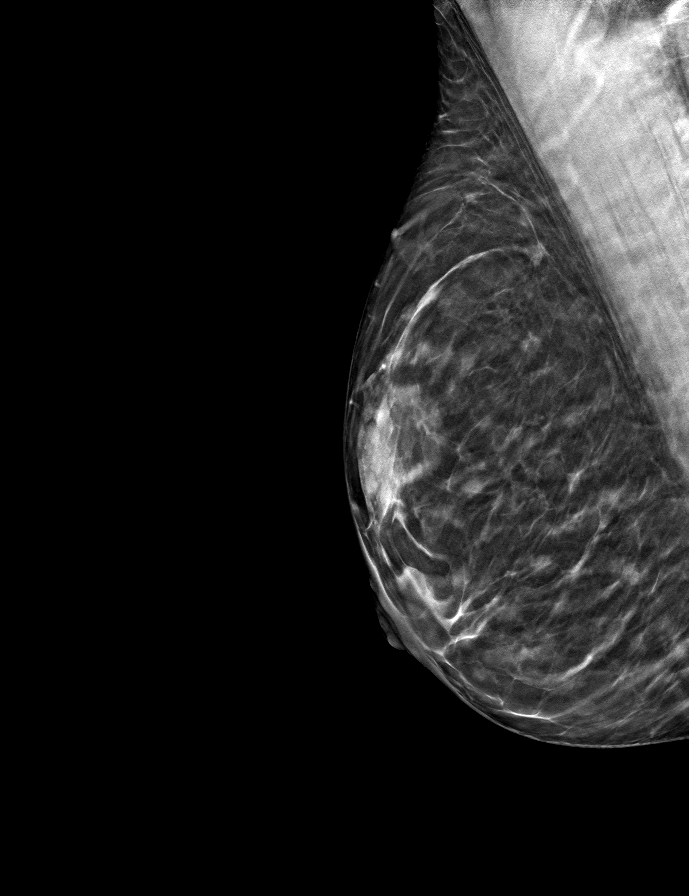
[frame 33/66]
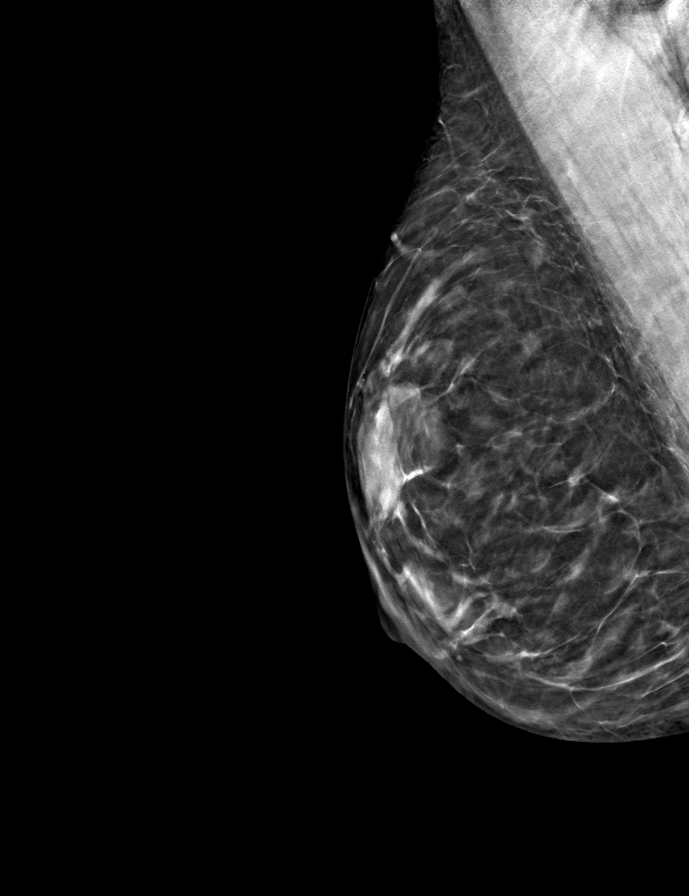

[L MLO tomo · tomo slice 33/64.0]
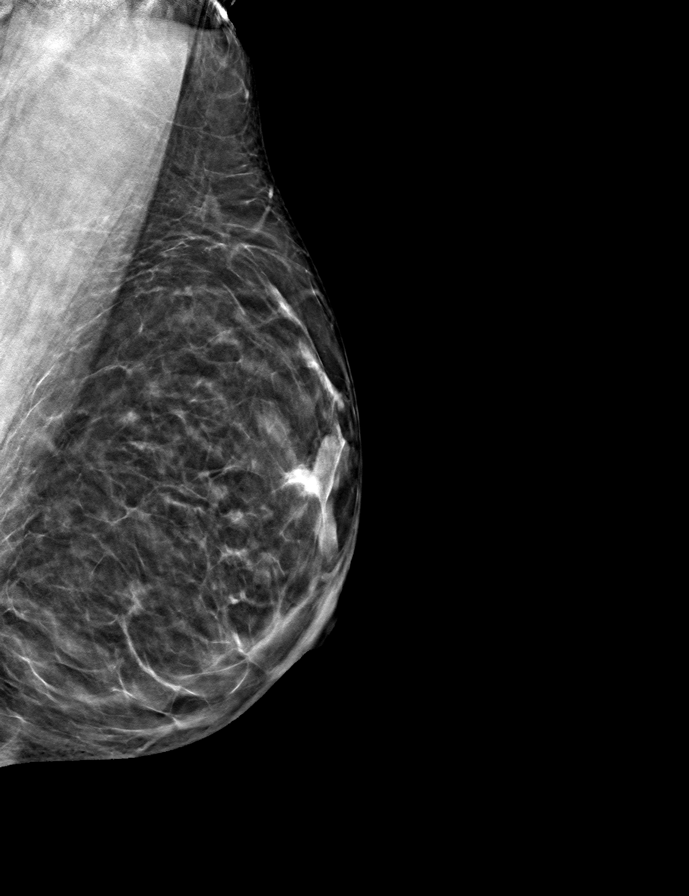

[L CC tomo · tomo slice 35/69.0]
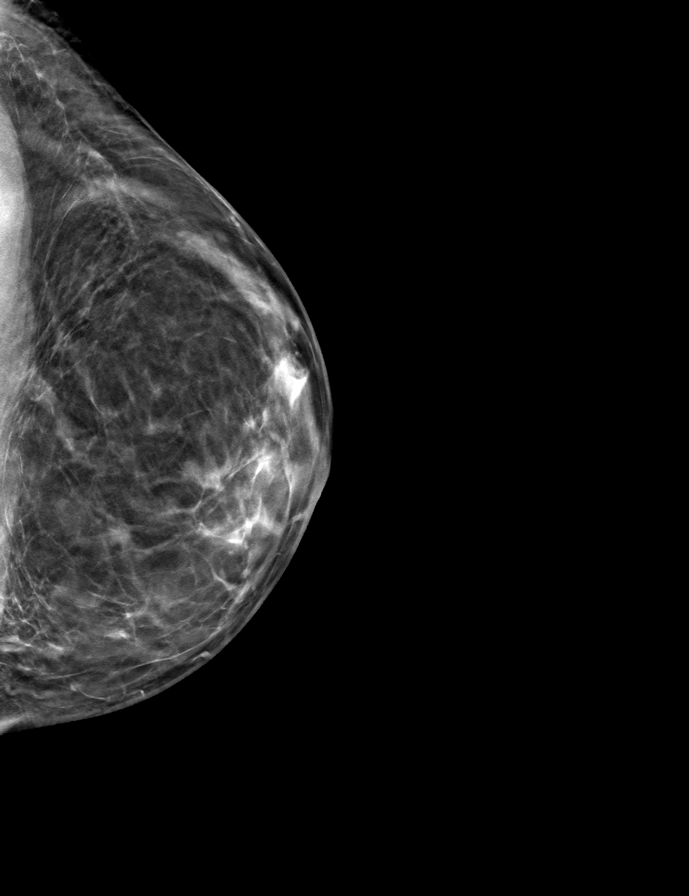

[R CC tomo · tomo slice 34/67.0]
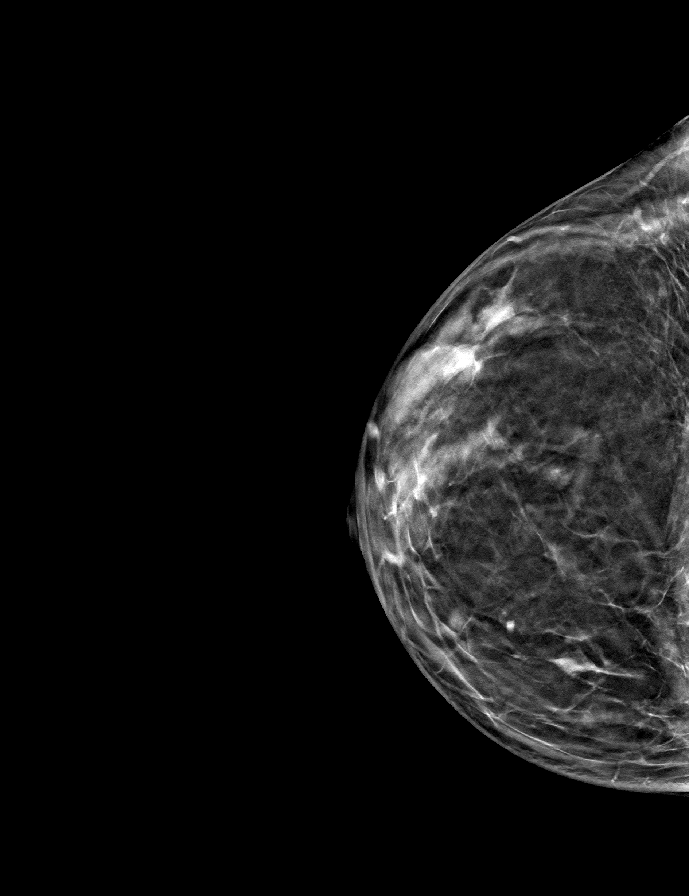

[9 of 24 positions shown; findings below may reference images not displayed]

ACR Breast Density Category b: There are scattered areas of
fibroglandular density.
FINDINGS: No suspicious mass, malignant type microcalcifications or distortion
detected in either breast.

Mammographic images were processed with CAD.
IMPRESSION: No evidence of malignancy in either breast.

RECOMMENDATION:
Bilateral screening mammogram in 1 year is recommended.

I have discussed the findings and recommendations with the patient.
If applicable, a reminder letter will be sent to the patient
regarding the next appointment.

BI-RADS CATEGORY  1: Negative.

## 2021-12-03 ENCOUNTER — Telehealth: Payer: Self-pay | Admitting: Registered Nurse

## 2021-12-03 ENCOUNTER — Encounter: Payer: Self-pay | Admitting: Registered Nurse

## 2021-12-03 NOTE — Telephone Encounter (Signed)
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  Took diflucan earlier this week and plans to repeat dose.  Still having some irritation end of urinary stream.  Rash not worsening or improving abdomen.  Feeling fatigued/end of her work week.  Discussed with patient to see PCM/GYN STD testing also--not available at [facility] clinic per contract.  Patient stated boyfriend prior to this one did cheat on her but she did not testing with her care team after she notified of cheating behavior.  She uses protection with current boyfriend. Schedule follow up appt for re-evaluation of rash especially if worsening. Patient verbalized understanding information and had no further questions at this time.

## 2021-12-14 ENCOUNTER — Telehealth: Payer: Self-pay | Admitting: Registered Nurse

## 2021-12-14 DIAGNOSIS — R300 Dysuria: Secondary | ICD-10-CM

## 2021-12-16 ENCOUNTER — Encounter: Payer: Self-pay | Admitting: Registered Nurse

## 2021-12-16 ENCOUNTER — Ambulatory Visit: Payer: No Typology Code available for payment source | Admitting: Registered Nurse

## 2021-12-16 VITALS — BP 132/87 | HR 70 | Temp 98.2°F | Resp 16

## 2021-12-16 DIAGNOSIS — H6993 Unspecified Eustachian tube disorder, bilateral: Secondary | ICD-10-CM

## 2021-12-16 DIAGNOSIS — J0120 Acute ethmoidal sinusitis, unspecified: Secondary | ICD-10-CM

## 2021-12-16 MED ORDER — PREDNISONE 10 MG PO TABS: 20.0000 mg | ORAL_TABLET | Freq: Every day | ORAL | 0 refills | Status: AC

## 2021-12-16 NOTE — Patient Instructions (Addendum)
Eustachian Tube Dysfunction  Eustachian tube dysfunction refers to a condition in which a blockage develops in the narrow passage that connects the middle ear to the back of the nose (eustachian tube). The eustachian tube regulates air pressure in the middle ear by letting air move between the ear and nose. It also helps to drain fluid from the middle ear space. Eustachian tube dysfunction can affect one or both ears. When the eustachian tube does not function properly, air pressure, fluid, or both can build up in the middle ear. What are the causes? This condition occurs when the eustachian tube becomes blocked or cannot open normally. Common causes of this condition include: Ear infections. Colds and other infections that affect the nose, mouth, and throat (upper respiratory tract). Allergies. Irritation from cigarette smoke. Irritation from stomach acid coming up into the esophagus (gastroesophageal reflux). The esophagus is the part of the body that moves food from the mouth to the stomach. Sudden changes in air pressure, such as from descending in an airplane or scuba diving. Abnormal growths in the nose or throat, such as: Growths that line the nose (nasal polyps). Abnormal growth of cells (tumors). Enlarged tissue at the back of the throat (adenoids). What increases the risk? You are more likely to develop this condition if: You smoke. You are overweight. You are a child who has: Certain birth defects of the mouth, such as cleft palate. Large tonsils or adenoids. What are the signs or symptoms? Common symptoms of this condition include: A feeling of fullness in the ear. Ear pain. Clicking or popping noises in the ear. Ringing in the ear (tinnitus). Hearing loss. Loss of balance. Dizziness. Symptoms may get worse when the air pressure around you changes, such as when you travel to an area of high elevation, fly on an airplane, or go scuba diving. How is this diagnosed? This  condition may be diagnosed based on: Your symptoms. A physical exam of your ears, nose, and throat. Tests, such as those that measure: The movement of your eardrum. Your hearing (audiometry). How is this treated? Treatment depends on the cause and severity of your condition. In mild cases, you may relieve your symptoms by moving air into your ears. This is called "popping the ears." In more severe cases, or if you have symptoms of fluid in your ears, treatment may include: Medicines to relieve congestion (decongestants). Medicines that treat allergies (antihistamines). Nasal sprays or ear drops that contain medicines that reduce swelling (steroids). A procedure to drain the fluid in your eardrum. In this procedure, a small tube may be placed in the eardrum to: Drain the fluid. Restore the air in the middle ear space. A procedure to insert a balloon device through the nose to inflate the opening of the eustachian tube (balloon dilation). Follow these instructions at home: Lifestyle Do not do any of the following until your health care provider approves: Travel to high altitudes. Fly in airplanes. Work in a pressurized cabin or room. Scuba dive. Do not use any products that contain nicotine or tobacco. These products include cigarettes, chewing tobacco, and vaping devices, such as e-cigarettes. If you need help quitting, ask your health care provider. Keep your ears dry. Wear fitted earplugs during showering and bathing. Dry your ears completely after. General instructions Take over-the-counter and prescription medicines only as told by your health care provider. Use techniques to help pop your ears as recommended by your health care provider. These may include: Chewing gum. Yawning. Frequent, forceful swallowing.   Closing your mouth, holding your nose closed, and gently blowing as if you are trying to blow air out of your nose. Keep all follow-up visits. This is important. Contact a  health care provider if: Your symptoms do not go away after treatment. Your symptoms come back after treatment. You are unable to pop your ears. You have: A fever. Pain in your ear. Pain in your head or neck. Fluid draining from your ear. Your hearing suddenly changes. You become very dizzy. You lose your balance. Get help right away if: You have a sudden, severe increase in any of your symptoms. Summary Eustachian tube dysfunction refers to a condition in which a blockage develops in the eustachian tube. It can be caused by ear infections, allergies, inhaled irritants, or abnormal growths in the nose or throat. Symptoms may include ear pain or fullness, hearing loss, or ringing in the ears. Mild cases are treated with techniques to unblock the ears, such as yawning or chewing gum. More severe cases are treated with medicines or procedures. This information is not intended to replace advice given to you by your health care provider. Make sure you discuss any questions you have with your health care provider. Document Revised: 05/04/2020 Document Reviewed: 05/04/2020 Elsevier Patient Education  [employer]. How to Perform a Sinus Rinse A sinus rinse is a home treatment that is used to rinse your sinuses with a germ-free (sterile) mixture of salt and water (saline solution). Sinuses are air-filled spaces in your skull that are behind the bones of your face and forehead. They open into your nasal cavity. A sinus rinse can help to clear mucus, dirt, dust, or pollen from your nasal cavity. You may do a sinus rinse when you have a cold, a virus, nasal allergy symptoms, a sinus infection, or stuffiness in your nose or sinuses. What are the risks? A sinus rinse is generally safe and effective. However, there are a few risks, which include: A burning sensation in your sinuses. This may happen if you do not make the saline solution as directed. Be sure to follow all directions when making  the saline solution. Nasal irritation. Infection. This may be from unclean supplies or from contaminated water. Infection from contaminated water is rare, but possible. Do not do a sinus rinse if you have had ear or nasal surgery, ear infection, or plugged ears, unless recommended by your health care provider. Supplies needed: Saline solution or powder. Distilled or sterile water to mix with saline powder. You may use boiled and cooled tap water. Boil tap water for 5 minutes; cool until it is lukewarm. Use within 24 hours. Do not use regular tap water to mix with the saline solution. Neti pot or nasal rinse bottle. These supplies release the saline solution into your nose and through your sinuses. Neti pots and nasal rinse bottles can be purchased at [employer], a health food store, or online. How to perform a sinus rinse  Wash your hands with soap and water for at least 20 seconds. If soap and water are not available, use hand sanitizer. Wash your device according to the directions that came with the product and then dry it. Use the solution that comes with your product or one that is sold separately in stores. Follow the mixing directions on the package to mix with sterile or distilled water. Fill the device with the amount of saline solution noted in the device instructions. Stand by a sink and tilt your head sideways  over the sink. Place the spout of the device in your upper nostril (the one closer to the ceiling). Gently pour or squeeze the saline solution into your nasal cavity. The liquid should drain out from the lower nostril if you are not too congested. While rinsing, breathe through your open mouth. Gently blow your nose to clear any mucus and rinse solution. Blowing too hard may cause ear pain. Turn your head in the other direction and repeat in your other nostril. Clean and rinse your device with clean water and then air-dry it. Talk with your health care provider or  pharmacist if you have questions about how to do a sinus rinse. Summary A sinus rinse is a home treatment that is used to rinse your sinuses with a sterile mixture of salt and water (saline solution). You may do a sinus rinse when you have a cold, a virus, nasal allergy symptoms, a sinus infection, or stuffiness in your nose or sinuses. A sinus rinse is generally safe and effective. Follow all instructions carefully. This information is not intended to replace advice given to you by your health care provider. Make sure you discuss any questions you have with your health care provider. Document Revised: 08/10/2020 Document Reviewed: 08/10/2020 Elsevier Patient Education  2023 [employer] Sinus Pain  Sinus pain may occur when your sinuses become clogged or swollen. Sinuses are air-filled spaces in your skull that are behind the bones of your face and forehead. Sinus pain can range from mild to severe. What are the causes? Sinus pain can result from various conditions that affect the sinuses. Common causes include: Colds. Sinus infections. Allergies. What are the signs or symptoms? The main symptom of this condition is pain or pressure in your face, forehead, ears, or upper teeth. People who have sinus pain often have other symptoms, such as: Congested or runny nose. Fever. Inability to smell. Headache. Weather changes can make symptoms worse. How is this diagnosed? Your health care provider will diagnose this condition based on your symptoms and a physical exam. If you have pain that keeps coming back or does not go away, your health care provider may recommend more testing. This may include: Imaging tests, such as a CT scan or MRI, to check for problems with your sinuses. Examination of your sinuses using a thin tool with a camera that is inserted through your nose (endoscopy). How is this treated? Treatment for this condition depends on the cause. Sinus pain that is caused by a sinus  infection may be treated with antibiotic medicine. Sinus pain that is caused by congestion may be helped by rinsing out (flushing) the nose and sinuses with saline solution. Sinus pain that is caused by allergies may be helped by allergy medicines (antihistamines) and medicated nasal sprays. Sinus surgery may be needed in some cases if other treatments do not help. Follow these instructions at home: General instructions If directed: Apply a warm, moist washcloth to your face to help relieve pain. Use a nasal saline wash. Follow the directions on the bottle or box. Hydrate and humidify Drink enough water to keep your urine clear or pale yellow. Staying hydrated will help to thin your mucus. Use a humidifier if your home is dry. Inhale steam for 10-15 minutes, 3-4 times a day or as told by your health care provider. You can do this in the bathroom while a hot shower is running. Limit your exposure to cool or dry air. Medicines  Take over-the-counter and prescription medicines only  as told by your health care provider. If you were prescribed an antibiotic medicine, take it as told by your health care provider. Do not stop taking the antibiotic even if you start to feel better. If you have congestion, use a nasal spray to help lessen pressure. Contact a health care provider if: You have sinus pain more than one time a week. You have sensitivity to light or sound. You develop a fever. You feel nauseous or you vomit. Your sinus pain or headache does not get better with treatment. Get help right away if: You have vision problems. You have sudden, severe pain in your face or head. You have a seizure. You are confused. You have a stiff neck. Summary Sinus pain occurs when your sinuses become clogged or swollen. Sinus pain can result from various conditions that affect the sinuses, such as a cold, a sinus infection, or an allergy. Treatment for this condition depends on the cause. It may  include medicine, such as antibiotics or antihistamines. This information is not intended to replace advice given to you by your health care provider. Make sure you discuss any questions you have with your health care provider. Document Revised: 01/24/2021 Document Reviewed: 01/24/2021 Elsevier Patient Education  2023 [employer] Sinus Infection, Adult A sinus infection, also called sinusitis, is inflammation of your sinuses. Sinuses are hollow spaces in the bones around your face. Your sinuses are located: Around your eyes. In the middle of your forehead. Behind your nose. In your cheekbones. Mucus normally drains out of your sinuses. When your nasal tissues become inflamed or swollen, mucus can become trapped or blocked. This allows bacteria, viruses, and fungi to grow, which leads to infection. Most infections of the sinuses are caused by a virus. A sinus infection can develop quickly. It can last for up to 4 weeks (acute) or for more than 12 weeks (chronic). A sinus infection often develops after a cold. What are the causes? This condition is caused by anything that creates swelling in the sinuses or stops mucus from draining. This includes: Allergies. Asthma. Infection from bacteria or viruses. Deformities or blockages in your nose or sinuses. Abnormal growths in the nose (nasal polyps). Pollutants, such as chemicals or irritants in the air. Infection from fungi. This is rare. What increases the risk? You are more likely to develop this condition if you: Have a weak body defense system (immune system). Do a lot of swimming or diving. Overuse nasal sprays. Smoke. What are the signs or symptoms? The main symptoms of this condition are pain and a feeling of pressure around the affected sinuses. Other symptoms include: Stuffy nose or congestion that makes it difficult to breathe through your nose. Thick yellow or greenish drainage from your nose. Tenderness, swelling, and warmth  over the affected sinuses. A cough that may get worse at night. Decreased sense of smell and taste. Extra mucus that collects in the throat or the back of the nose (postnasal drip) causing a sore throat or bad breath. Tiredness (fatigue). Fever. How is this diagnosed? This condition is diagnosed based on: Your symptoms. Your medical history. A physical exam. Tests to find out if your condition is acute or chronic. This may include: Checking your nose for nasal polyps. Viewing your sinuses using a device that has a light (endoscope). Testing for allergies or bacteria. Imaging tests, such as an MRI or CT scan. In rare cases, a bone biopsy may be done to rule out more serious types of fungal sinus  disease. How is this treated? Treatment for a sinus infection depends on the cause and whether your condition is chronic or acute. If caused by a virus, your symptoms should go away on their own within 10 days. You may be given medicines to relieve symptoms. They include: Medicines that shrink swollen nasal passages (decongestants). A spray that eases inflammation of the nostrils (topical intranasal corticosteroids). Rinses that help get rid of thick mucus in your nose (nasal saline washes). Medicines that treat allergies (antihistamines). Over-the-counter pain relievers. If caused by bacteria, your health care provider may recommend waiting to see if your symptoms improve. Most bacterial infections will get better without antibiotic medicine. You may be given antibiotics if you have: A severe infection. A weak immune system. If caused by narrow nasal passages or nasal polyps, surgery may be needed. Follow these instructions at home: Medicines Take, use, or apply over-the-counter and prescription medicines only as told by your health care provider. These may include nasal sprays. If you were prescribed an antibiotic medicine, take it as told by your health care provider. Do not stop taking the  antibiotic even if you start to feel better. Hydrate and humidify  Drink enough fluid to keep your urine pale yellow. Staying hydrated will help to thin your mucus. Use a cool mist humidifier to keep the humidity level in your home above 50%. Inhale steam for 10-15 minutes, 3-4 times a day, or as told by your health care provider. You can do this in the bathroom while a hot shower is running. Limit your exposure to cool or dry air. Rest Rest as much as possible. Sleep with your head raised (elevated). Make sure you get enough sleep each night. General instructions  Apply a warm, moist washcloth to your face 3-4 times a day or as told by your health care provider. This will help with discomfort. Use nasal saline washes as often as told by your health care provider. Wash your hands often with soap and water to reduce your exposure to germs. If soap and water are not available, use hand sanitizer. Do not smoke. Avoid being around people who are smoking (secondhand smoke). Keep all follow-up visits. This is important. Contact a health care provider if: You have a fever. Your symptoms get worse. Your symptoms do not improve within 10 days. Get help right away if: You have a severe headache. You have persistent vomiting. You have severe pain or swelling around your face or eyes. You have vision problems. You develop confusion. Your neck is stiff. You have trouble breathing. These symptoms may be an emergency. Get help right away. Call 911. Do not wait to see if the symptoms will go away. Do not drive yourself to the hospital. Summary A sinus infection is soreness and inflammation of your sinuses. Sinuses are hollow spaces in the bones around your face. This condition is caused by nasal tissues that become inflamed or swollen. The swelling traps or blocks the flow of mucus. This allows bacteria, viruses, and fungi to grow, which leads to infection. If you were prescribed an antibiotic  medicine, take it as told by your health care provider. Do not stop taking the antibiotic even if you start to feel better. Keep all follow-up visits. This is important. This information is not intended to replace advice given to you by your health care provider. Make sure you discuss any questions you have with your health care provider. Document Revised: 01/26/2021 Document Reviewed: 01/26/2021 Elsevier Patient Education  2023  [employer].

## 2021-12-16 NOTE — Progress Notes (Signed)
Subjective:    Patient ID: Sydney Spencer, female    DOB: [birth year], 46 y.o.   MRN: [id number]  46y/o caucasian female established patient here for sinus headache not improving still pain/pressure bridge of nose/between eyebrows.  Mucous cloudy white now and ear pain/pressure.  Denied ear discharge/fever/chills/n/v/d/worst headache of her life/teeth pain/scalp pain.  Cold temperatures am bothering her the most.  Taking claritin/nasal saline/tylenol. Prefers Rx be sent to [facility], [place].  Post nasal drip and congestion.  Denied difficulty clearing ears/body aches/loss of taste/smell or known sick/covid positive contacts.      Review of Systems  Constitutional:  Positive for fatigue. Negative for activity change, appetite change, chills, diaphoresis and fever.  HENT:  Positive for congestion, ear pain, postnasal drip, rhinorrhea, sinus pressure, sinus pain and sore throat. Negative for ear discharge, facial swelling, hearing loss, mouth sores, nosebleeds, sneezing, tinnitus, trouble swallowing and voice change.   Eyes:  Negative for photophobia, discharge, redness and visual disturbance.  Respiratory:  Positive for cough. Negative for choking, chest tightness, shortness of breath, wheezing and stridor.   Cardiovascular:  Negative for chest pain.  Gastrointestinal:  Negative for diarrhea, nausea and vomiting.  Genitourinary:  Negative for difficulty urinating.  Musculoskeletal:  Negative for gait problem, neck pain and neck stiffness.  Skin:  Negative for color change, pallor, rash and wound.  Allergic/Immunologic: Positive for environmental allergies. Negative for food allergies.  Neurological:  Positive for headaches. Negative for dizziness, tremors, seizures, syncope, facial asymmetry, speech difficulty, weakness, light-headedness and numbness.  Psychiatric/Behavioral:  Negative for agitation, confusion and sleep disturbance.        Objective:   Physical  Exam Vitals and nursing note reviewed.  Constitutional:      General: She is awake. She is not in acute distress.    Appearance: Normal appearance. She is well-developed, well-groomed and normal weight. She is not ill-appearing, toxic-appearing or diaphoretic.  HENT:     Head: Normocephalic and atraumatic.     Jaw: There is normal jaw occlusion. No trismus.     Salivary Glands: Right salivary gland is not diffusely enlarged or tender. Left salivary gland is not diffusely enlarged or tender.     Right Ear: Hearing, ear canal and external ear normal. No decreased hearing noted. No laceration, drainage, swelling or tenderness. A middle ear effusion is present. There is no impacted cerumen. No foreign body. No mastoid tenderness. No PE tube. No hemotympanum. Tympanic membrane is retracted. Tympanic membrane is not injected, scarred, perforated, erythematous or bulging.     Left Ear: Hearing, ear canal and external ear normal. No decreased hearing noted. No laceration, drainage, swelling or tenderness. A middle ear effusion is present. There is no impacted cerumen. No foreign body. No mastoid tenderness. No PE tube. No hemotympanum. Tympanic membrane is scarred and retracted. Tympanic membrane is not injected, perforated, erythematous or bulging.     Ears:     Comments: Air fluid level clear bilateral TMs intact and retracted    Nose: Mucosal edema, congestion and rhinorrhea present. No nasal deformity, septal deviation or laceration. Rhinorrhea is clear.     Right Turbinates: Enlarged and swollen. Not pale.     Left Turbinates: Enlarged and swollen. Not pale.     Right Sinus: Maxillary sinus tenderness and frontal sinus tenderness present.     Left Sinus: Maxillary sinus tenderness and frontal sinus tenderness present.     Comments: Very mildly TTP bilateral frontal and maxillary sinuses; bilateral allergic shiners and  lower/upper eyelids swelling 1+/4 nonpitting; bilateral nasal turbinates  edema/erythema; cobblestoning posterior pharynx; tonsils 1-2+/4 bilaterally    Mouth/Throat:     Lips: Pink. No lesions.     Mouth: Mucous membranes are moist. Mucous membranes are not pale, not dry and not cyanotic. No lacerations, oral lesions or angioedema.     Dentition: No dental abscesses or gum lesions.     Tongue: No lesions. Tongue does not deviate from midline.     Palate: No mass and lesions.     Pharynx: Uvula midline. Pharyngeal swelling and posterior oropharyngeal erythema present. No oropharyngeal exudate or uvula swelling.     Tonsils: No tonsillar exudate or tonsillar abscesses. 1+ on the right. 1+ on the left.  Eyes:     General: Lids are normal. Vision grossly intact. Gaze aligned appropriately. Allergic shiner present. No scleral icterus.       Right eye: No foreign body, discharge or hordeolum.        Left eye: No foreign body, discharge or hordeolum.     Extraocular Movements:     Right eye: Normal extraocular motion and no nystagmus.     Left eye: Normal extraocular motion and no nystagmus.     Conjunctiva/sclera: Conjunctivae normal.     Right eye: Right conjunctiva is not injected. No chemosis, exudate or hemorrhage.    Left eye: Left conjunctiva is not injected. No chemosis, exudate or hemorrhage.    Pupils: Pupils are equal, round, and reactive to light. Pupils are equal.     Right eye: Pupil is round and reactive.     Left eye: Pupil is round and reactive.  Neck:     Thyroid: No thyroid mass or thyromegaly.     Trachea: Trachea and phonation normal. No tracheal tenderness or tracheal deviation.  Cardiovascular:     Rate and Rhythm: Normal rate and regular rhythm.     Pulses: Normal pulses.          Radial pulses are 2+ on the right side and 2+ on the left side.     Heart sounds: Normal heart sounds, S1 normal and S2 normal.  Pulmonary:     Effort: Pulmonary effort is normal. No accessory muscle usage or respiratory distress.     Breath sounds: Normal breath  sounds and air entry. No stridor, decreased air movement or transmitted upper airway sounds. No decreased breath sounds, wheezing, rhonchi or rales.     Comments: Spoke full sentences without difficulty; no cough observed in clinic; tobacco smoke smell on breath Chest:     Chest wall: No tenderness.  Abdominal:     General: There is no distension.     Palpations: Abdomen is soft.  Musculoskeletal:        General: No tenderness. Normal range of motion.     Right hand: Normal strength. Normal capillary refill.     Left hand: Normal strength. Normal capillary refill.     Cervical back: Normal range of motion and neck supple. No swelling, edema, deformity, erythema, signs of trauma, lacerations, rigidity, torticollis, tenderness or crepitus. No pain with movement. Normal range of motion.     Thoracic back: No swelling, edema, deformity, signs of trauma, lacerations, spasms or tenderness.     Right lower leg: No edema.     Left lower leg: No edema.  Lymphadenopathy:     Head:     Right side of head: No submental, submandibular, tonsillar, preauricular, posterior auricular or occipital adenopathy.  Left side of head: No submental, submandibular, tonsillar, preauricular, posterior auricular or occipital adenopathy.     Cervical: No cervical adenopathy.     Right cervical: No superficial, deep or posterior cervical adenopathy.    Left cervical: No superficial, deep or posterior cervical adenopathy.  Skin:    General: Skin is warm and dry.     Capillary Refill: Capillary refill takes less than 2 seconds.     Coloration: Skin is not ashen, cyanotic, jaundiced, mottled, pale or sallow.     Findings: No abrasion, abscess, acne, bruising, burn, ecchymosis, erythema, signs of injury, laceration, lesion, petechiae, rash or wound.     Nails: There is no clubbing.  Neurological:     General: No focal deficit present.     Mental Status: She is alert and oriented to person, place, and time. Mental  status is at baseline. She is not disoriented.     GCS: GCS eye subscore is 4. GCS verbal subscore is 5. GCS motor subscore is 6.     Cranial Nerves: Cranial nerves 2-12 are intact. No cranial nerve deficit, dysarthria or facial asymmetry.     Sensory: No sensory deficit.     Motor: Motor function is intact. No weakness, tremor, atrophy, abnormal muscle tone or seizure activity.     Coordination: Coordination is intact. Coordination normal.     Gait: Gait is intact. Gait normal.     Comments: In/out of chair without difficulty; gait sure and steady in clinic; bilateral hand grasp equal 5/5  Psychiatric:        Attention and Perception: Attention and perception normal.        Mood and Affect: Mood and affect normal.        Speech: Speech normal.        Behavior: Behavior normal. Behavior is cooperative.        Thought Content: Thought content normal.        Cognition and Memory: Cognition and memory normal.        Judgment: Judgment normal.           Assessment & Plan:   A-acute ethmoid sinusitis recurrence not specified; eustachian tube dysfunction bilateral; nicotine dependence  P-consider restart flonase 1 spray each nostril BID OTC, increase frequency of saline 2 sprays each nostril q2h wa prn congestion. Electronic Rx prednisone 10mg  sig t2 po daily x 4 days #8 RF0 to her pharmacy of choice.  Discussed if foul taste in mouth/white spots back of throat, fever/chills to contact me via email [email or web address] as clinic closed until next week starting tomorrow no RN clinic.  Continue claritin 10mg  po daily.  If nasal turbinates become more swollen may consider afrin 1 spray each nostril BID x 3-4 days.  Shower after work/prior to bedtime as works in [employer].  May consider mucinex/guaifenesin 600mg  po BID if thick mucous/tencious.  Drink water to help thin mucous.  Denied personal or family history of ENT cancer.  Shower BID especially prior to bed. No evidence of systemic  bacterial infection, non toxic and well hydrated.  I do not see where any further testing or imaging is necessary at this time.   I will suggest supportive care, rest, good hygiene and encourage the patient to take adequate fluids.  The patient is to return to clinic or EMERGENCY ROOM if symptoms worsen or change significantly.  Exitcare handout on sinusitis/sinus pain and sinus rinse.  Patient verbalized agreement and understanding of treatment plan and had no further questions  at this time.   P2:  Hand washing and cover cough    No evidence of invasive bacterial infection, non toxic and well hydrated.  I do not see where any further testing or imaging is necessary at this time.   I will suggest supportive care, rest, good hygiene and encourage the patient to take adequate fluids.  The patient is to return to clinic or EMERGENCY ROOM if symptoms worsen or change significantly e.g. ear pain, fever, purulent discharge from ears or bleeding.  Exitcare handout on otitis media and eustachian tube dysfunction printed and given to patient.  Discussed with patient post nasal drip irritates throat/causes swelling blocks eustachian tubes from draining and fluid fills up middle ear.  Bacteria/viruses can grow in fluid and with moving head tube compressed and increases pressure in tube/ear worsening pain.  Studies show will take 30 days for fluid to resolve after post nasal drip controlled with nasal steroid/antihistamine. Antibiotics and steroids do not speed up fluid removal.  Patient verbalized agreement and understanding of treatment plan and had no further questions at this time.   Patient not ready to stop smoking at this time.

## 2021-12-18 NOTE — Telephone Encounter (Signed)
See tcon dated 12/14/21 and office visit 12/16/21

## 2021-12-21 ENCOUNTER — Ambulatory Visit: Payer: No Typology Code available for payment source | Admitting: Registered Nurse

## 2021-12-21 VITALS — HR 72 | Temp 97.2°F | Resp 16

## 2021-12-21 DIAGNOSIS — F17200 Nicotine dependence, unspecified, uncomplicated: Secondary | ICD-10-CM

## 2021-12-21 DIAGNOSIS — J0120 Acute ethmoidal sinusitis, unspecified: Secondary | ICD-10-CM

## 2021-12-21 DIAGNOSIS — H6993 Unspecified Eustachian tube disorder, bilateral: Secondary | ICD-10-CM

## 2021-12-21 MED ORDER — PREDNISONE 10 MG PO TABS: ORAL_TABLET | ORAL | 0 refills | Status: AC

## 2021-12-22 ENCOUNTER — Encounter: Payer: Self-pay | Admitting: Registered Nurse

## 2021-12-22 NOTE — Progress Notes (Signed)
Subjective:    Patient ID: Sydney Spencer, female    DOB: August 10, 1975, 46 y.o.   MRN: [id number]  46y/o caucasian female established patient here for worsening headache after stopping prednisone/congestion last office visit 12 Oct 20mg  prednisone pulse oral.  Sore throat improved a lot still clear discharge.  Bridge of nose/between eyebrows center of her headache.  Denied fever/chills/n/v/d/wheezing/shortness of breath     Review of Systems Constitutional:  Positive for fatigue. Negative for activity change, appetite change, chills, diaphoresis and fever.  HENT:  Positive for congestion, ear pain, postnasal drip, rhinorrhea, sinus pressure, sinus pain and sore throat. Negative for ear discharge, facial swelling, hearing loss, mouth sores, nosebleeds, sneezing, tinnitus, trouble swallowing and voice change.   Eyes:  Negative for photophobia, discharge, redness and visual disturbance.  Respiratory:  Positive for cough. Negative for choking, chest tightness, shortness of breath, wheezing and stridor.   Cardiovascular:  Negative for chest pain.  Gastrointestinal:  Negative for diarrhea, nausea and vomiting.  Genitourinary:  Negative for difficulty urinating.  Musculoskeletal:  Negative for gait problem, neck pain and neck stiffness.  Skin:  Negative for color change, pallor, rash and wound.  Allergic/Immunologic: Positive for environmental allergies. Negative for food allergies.  Neurological:  Positive for headaches. Negative for dizziness, tremors, seizures, syncope, facial asymmetry, speech difficulty, weakness, light-headedness and numbness.  Psychiatric/Behavioral:  Negative for agitation, confusion and sleep disturbance.            Objective:   Physical Exam   Vitals and nursing note reviewed.  Constitutional:      General: She is awake. She is not in acute distress.    Appearance: Normal appearance. She is well-developed, well-groomed and normal weight. She is not ill-appearing,  toxic-appearing or diaphoretic.  HENT:     Head: Normocephalic and atraumatic.     Jaw: There is normal jaw occlusion. No trismus.     Salivary Glands: Right salivary gland is not diffusely enlarged or tender. Left salivary gland is not diffusely enlarged or tender.     Right Ear: Hearing, ear canal and external ear normal. No decreased hearing noted. No laceration, drainage, swelling or tenderness. A middle ear effusion is present. There is no impacted cerumen. No foreign body. No mastoid tenderness. No PE tube. No hemotympanum. Tympanic membrane is retracted. Tympanic membrane is not injected, scarred, perforated, erythematous or bulging.     Left Ear: Hearing, ear canal and external ear normal. No decreased hearing noted. No laceration, drainage, swelling or tenderness. A middle ear effusion is present. There is no impacted cerumen. No foreign body. No mastoid tenderness. No PE tube. No hemotympanum. Tympanic membrane is scarred and retracted. Tympanic membrane is not injected, perforated, erythematous or bulging.     Ears:     Comments: Air fluid level clear bilateral TMs intact and retracted    Nose: Mucosal edema, congestion and rhinorrhea present. No nasal deformity, septal deviation or laceration. Rhinorrhea is clear.     Right Turbinates: Enlarged and swollen. Not pale.     Left Turbinates: Enlarged and swollen. Not pale.     Right Sinus: Maxillary sinus tenderness and frontal sinus tenderness present.     Left Sinus: Maxillary sinus tenderness and frontal sinus tenderness present.     Comments: Very mildly TTP bilateral frontal and maxillary sinuses; bilateral allergic shiners and lower/upper eyelids swelling 0-1+/4 nonpitting; bilateral nasal turbinates edema/erythema; cobblestoning posterior pharynx; tonsils 1+/4 bilaterally    Mouth/Throat:     Lips: Pink. No lesions.  Mouth: Mucous membranes are moist. Mucous membranes are not pale, not dry and not cyanotic. No lacerations, oral  lesions or angioedema.     Dentition: No dental abscesses or gum lesions.     Tongue: No lesions. Tongue does not deviate from midline.     Palate: No mass and lesions.     Pharynx: Uvula midline. Pharyngeal swelling and posterior oropharyngeal erythema present. No oropharyngeal exudate or uvula swelling.     Tonsils: No tonsillar exudate or tonsillar abscesses. 1+ on the right. 1+ on the left.  Eyes:     General: Lids are normal. Vision grossly intact. Gaze aligned appropriately. Allergic shiner present. No scleral icterus.       Right eye: No foreign body, discharge or hordeolum.        Left eye: No foreign body, discharge or hordeolum.     Extraocular Movements:     Right eye: Normal extraocular motion and no nystagmus.     Left eye: Normal extraocular motion and no nystagmus.     Conjunctiva/sclera: Conjunctivae normal.     Right eye: Right conjunctiva is not injected. No chemosis, exudate or hemorrhage.    Left eye: Left conjunctiva is not injected. No chemosis, exudate or hemorrhage.    Pupils: Pupils are equal, round, and reactive to light. Pupils are equal.     Right eye: Pupil is round and reactive.     Left eye: Pupil is round and reactive.  Neck:     Thyroid: No thyroid mass or thyromegaly.     Trachea: Trachea and phonation normal. No tracheal tenderness or tracheal deviation.  Cardiovascular:     Rate and Rhythm: Normal rate and regular rhythm.     Pulses: Normal pulses.          Radial pulses are 2+ on the right side and 2+ on the left side.     Heart sounds: Normal heart sounds, S1 normal and S2 normal.  Pulmonary:     Effort: Pulmonary effort is normal. No accessory muscle usage or respiratory distress.     Breath sounds: Normal breath sounds and air entry. No stridor, decreased air movement or transmitted upper airway sounds. No decreased breath sounds, wheezing, rhonchi or rales.     Comments: Spoke full sentences without difficulty; no cough observed in clinic Chest:      Chest wall: No tenderness.  Abdominal:     General: There is no distension.     Palpations: Abdomen is soft.  Musculoskeletal:        General: No tenderness. Normal range of motion.     Right hand: Normal strength. Normal capillary refill.     Left hand: Normal strength. Normal capillary refill.     Cervical back: Normal range of motion and neck supple. No swelling, edema, deformity, erythema, signs of trauma, lacerations, rigidity, torticollis, tenderness or crepitus. No pain with movement. Normal range of motion.     Thoracic back: No swelling, edema, deformity, signs of trauma, lacerations, spasms or tenderness.     Right lower leg: No edema.     Left lower leg: No edema.  Lymphadenopathy:     Head:     Right side of head: No submental, submandibular, tonsillar, preauricular, posterior auricular or occipital adenopathy.     Left side of head: No submental, submandibular, tonsillar, preauricular, posterior auricular or occipital adenopathy.     Cervical: No cervical adenopathy.     Right cervical: No superficial, deep or posterior cervical adenopathy.  Left cervical: No superficial, deep or posterior cervical adenopathy.  Skin:    General: Skin is warm and dry.     Capillary Refill: Capillary refill takes less than 2 seconds.     Coloration: Skin is not ashen, cyanotic, jaundiced, mottled, pale or sallow.     Findings: No abrasion, abscess, acne, bruising, burn, ecchymosis, erythema, signs of injury, laceration, lesion, petechiae, rash or wound.     Nails: There is no clubbing.  Neurological:     General: No focal deficit present.     Mental Status: She is alert and oriented to person, place, and time. Mental status is at baseline. She is not disoriented.     GCS: GCS eye subscore is 4. GCS verbal subscore is 5. GCS motor subscore is 6.     Cranial Nerves: Cranial nerves 2-12 are intact. No cranial nerve deficit, dysarthria or facial asymmetry.     Sensory: No sensory deficit.      Motor: Motor function is intact. No weakness, tremor, atrophy, abnormal muscle tone or seizure activity.     Coordination: Coordination is intact. Coordination normal.     Gait: Gait is intact. Gait normal.     Comments: In/out of chair without difficulty; gait sure and steady in clinic; bilateral hand grasp equal 5/5  Psychiatric:        Attention and Perception: Attention and perception normal.        Mood and Affect: Mood and affect normal.        Speech: Speech normal.        Behavior: Behavior normal. Behavior is cooperative.        Thought Content: Thought content normal.        Cognition and Memory: Cognition and memory normal.        Judgment: Judgment normal.              Assessment & Plan:  A-acute ethmoid sinusitis recurrence not specified; eustachian tube dysfunction bilateral; nicotine dependence   P-consider restart flonase 1 spray each nostril BID OTC, increase frequency of saline 2 sprays each nostril q2h wa prn congestion. Electronic Rx prednisone 10mg  sig t6 po day 1; 5 day 2; 4 day 3; 3 day 4; 2 day 5 and 1 day 6 with breakfast #21 RF0 to her pharmacy of choice.  Discussed if foul taste in mouth/white spots back of throat, fever/chills to contact me via email [email or web address] as clinic closed tomorrow.  Reopens 10/19.  Continue claritin 10mg  po daily.  If nasal turbinates become more swollen may consider afrin 1 spray each nostril BID x 3-4 days.  Shower after work/prior to bedtime as works in 11/19.  May consider mucinex/guaifenesin 600mg  po BID if thick mucous/tencious.  Drink water to help thin mucous.  Denied personal or family history of ENT cancer.  Shower BID especially prior to bed. No evidence of systemic bacterial infection, non toxic and well hydrated.  I do not see where any further testing or imaging is necessary at this time.   I will suggest supportive care, rest, good hygiene and encourage the patient to take adequate fluids.  The  patient is to return to clinic or EMERGENCY ROOM if symptoms worsen or change significantly.  Exitcare handout on sinus pain  Patient verbalized agreement and understanding of treatment plan and had no further questions at this time.   P2:  Hand washing and cover cough     No evidence of invasive bacterial infection, non toxic and well  hydrated.  I do not see where any further testing or imaging is necessary at this time.   I will suggest supportive care, rest, good hygiene and encourage the patient to take adequate fluids.  The patient is to return to clinic or EMERGENCY ROOM if symptoms worsen or change significantly e.g. ear pain, fever, purulent discharge from ears or bleeding. Discussed with patient post nasal drip irritates throat/causes swelling blocks eustachian tubes from draining and fluid fills up middle ear.  Bacteria/viruses can grow in fluid and with moving head tube compressed and increases pressure in tube/ear worsening pain.  Studies show will take 30 days for fluid to resolve after post nasal drip controlled with nasal steroid/antihistamine. Antibiotics and steroids do not speed up fluid removal.  Patient verbalized agreement and understanding of treatment plan and had no further questions at this time.    Patient not ready to stop smoking at this time.

## 2021-12-22 NOTE — Patient Instructions (Signed)
Sinus Pain  Sinus pain may occur when your sinuses become clogged or swollen. Sinuses are air-filled spaces in your skull that are behind the bones of your face and forehead. Sinus pain can range from mild to severe. What are the causes? Sinus pain can result from various conditions that affect the sinuses. Common causes include: Colds. Sinus infections. Allergies. What are the signs or symptoms? The main symptom of this condition is pain or pressure in your face, forehead, ears, or upper teeth. People who have sinus pain often have other symptoms, such as: Congested or runny nose. Fever. Inability to smell. Headache. Weather changes can make symptoms worse. How is this diagnosed? Your health care provider will diagnose this condition based on your symptoms and a physical exam. If you have pain that keeps coming back or does not go away, your health care provider may recommend more testing. This may include: Imaging tests, such as a CT scan or MRI, to check for problems with your sinuses. Examination of your sinuses using a thin tool with a camera that is inserted through your nose (endoscopy). How is this treated? Treatment for this condition depends on the cause. Sinus pain that is caused by a sinus infection may be treated with antibiotic medicine. Sinus pain that is caused by congestion may be helped by rinsing out (flushing) the nose and sinuses with saline solution. Sinus pain that is caused by allergies may be helped by allergy medicines (antihistamines) and medicated nasal sprays. Sinus surgery may be needed in some cases if other treatments do not help. Follow these instructions at home: General instructions If directed: Apply a warm, moist washcloth to your face to help relieve pain. Use a nasal saline wash. Follow the directions on the bottle or box. Hydrate and humidify Drink enough water to keep your urine clear or pale yellow. Staying hydrated will help to thin your  mucus. Use a humidifier if your home is dry. Inhale steam for 10-15 minutes, 3-4 times a day or as told by your health care provider. You can do this in the bathroom while a hot shower is running. Limit your exposure to cool or dry air. Medicines  Take over-the-counter and prescription medicines only as told by your health care provider. If you were prescribed an antibiotic medicine, take it as told by your health care provider. Do not stop taking the antibiotic even if you start to feel better. If you have congestion, use a nasal spray to help lessen pressure. Contact a health care provider if: You have sinus pain more than one time a week. You have sensitivity to light or sound. You develop a fever. You feel nauseous or you vomit. Your sinus pain or headache does not get better with treatment. Get help right away if: You have vision problems. You have sudden, severe pain in your face or head. You have a seizure. You are confused. You have a stiff neck. Summary Sinus pain occurs when your sinuses become clogged or swollen. Sinus pain can result from various conditions that affect the sinuses, such as a cold, a sinus infection, or an allergy. Treatment for this condition depends on the cause. It may include medicine, such as antibiotics or antihistamines. This information is not intended to replace advice given to you by your health care provider. Make sure you discuss any questions you have with your health care provider. Document Revised: 01/24/2021 Document Reviewed: 01/24/2021 Elsevier Patient Education  2023 [employer]  

## 2022-01-03 NOTE — Telephone Encounter (Signed)
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  Lymph nodes not enlarging further/maybe improving.  Rash same on abdomen.  Recommended patient to schedule appt with me onsite 9-12 today in clinic.  Patient verbalized understanding information/instructions, agreed with plan of care and had no further questions at this time.  A&Ox3 spoke full sentences without difficulty skin warm dry and pink gait sure and steady respirations even and unlabored RA

## 2022-02-02 ENCOUNTER — Encounter: Payer: Self-pay | Admitting: Registered Nurse

## 2022-02-02 ENCOUNTER — Telehealth: Payer: Self-pay | Admitting: Registered Nurse

## 2022-02-02 DIAGNOSIS — J00 Acute nasopharyngitis [common cold]: Secondary | ICD-10-CM

## 2022-02-02 NOTE — Telephone Encounter (Signed)
Patient reported has had a couple days of nasal congestion/rhinitis and other coworkers with similar symptoms.  I recommended covid testing as circulating in community along with flu/rsv/adenovirus.  Cold weather also causing vasomotor rhinitis.  Patient thinks she has a cold.  Given 1 bottle nasal saline from clinic stock.  Refused phenylephrine UD.  Stated she has otc cough/cold medicine to use but just needed another bottle of saline at this time.  Discussed mask wear if cough/runny nose at work to help prevent spread of illness.  Patient refused covid testing stated she is on day 6 of symptoms.  I again recommended tested and she stated no swabs up my nose.  Patient also concerned she will get pneumonia if she wears mask.  Discussed with patient I have not seen any research to validate mask wear leads to pneumonia in fact mask wear has been shown to decrease spread of infection in populations when airborne.  I asked patient to send me copy of article/research so I could review what she read. Patient did not agree to send to me.  A&Ox3 spoke full sentences without difficulty nasal congestion audible along with throat clearing.  No cough/wheezing/shortness of breath observed.  RN Kimrey notified I do recommend covid testing for patient if she returns to clinic later today.

## 2022-02-05 NOTE — Telephone Encounter (Signed)
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  Follow up if new or worsening symptoms.  Patient verbalized understanding information/instructions and had no further questions at this time.

## 2022-08-25 ENCOUNTER — Ambulatory Visit: Payer: No Typology Code available for payment source | Admitting: Occupational Medicine

## 2022-08-25 DIAGNOSIS — M25571 Pain in right ankle and joints of right foot: Secondary | ICD-10-CM

## 2022-08-25 DIAGNOSIS — M25473 Effusion, unspecified ankle: Secondary | ICD-10-CM

## 2022-08-25 DIAGNOSIS — T63441A Toxic effect of venom of bees, accidental (unintentional), initial encounter: Secondary | ICD-10-CM

## 2022-08-25 NOTE — Progress Notes (Signed)
Patient was mowing yesterday got stung multiple times by hornets. Stinger got caught in the right ankle noted swelling and redness. Patient reports mild pain and some itching. Patient given hydrocortisone cream and calagel for itching. Told she can take ibuprofen or tylenol for pain.  Patient given ice pack to use on break. Ice packs for swelling and elevated at night. Educated if has increase swelling redness or warmth to contact clinic or [facility].

## 2023-06-01 ENCOUNTER — Ambulatory Visit: Admitting: Registered Nurse

## 2023-06-01 ENCOUNTER — Encounter: Payer: Self-pay | Admitting: Registered Nurse

## 2023-06-01 VITALS — HR 76 | Resp 16

## 2023-06-01 DIAGNOSIS — T148XXA Other injury of unspecified body region, initial encounter: Secondary | ICD-10-CM

## 2023-06-01 DIAGNOSIS — S61217A Laceration without foreign body of left little finger without damage to nail, initial encounter: Secondary | ICD-10-CM

## 2023-06-01 MED ORDER — TRIPLE ANTIBIOTIC 3.5-400-5000 EX OINT: 1.0000 | TOPICAL_OINTMENT | Freq: Every day | CUTANEOUS | Status: AC | PRN

## 2023-06-01 MED ORDER — AMOXICILLIN-POT CLAVULANATE 875-125 MG PO TABS: 1.0000 | ORAL_TABLET | Freq: Two times a day (BID) | ORAL | 0 refills | Status: AC

## 2023-06-01 NOTE — Patient Instructions (Signed)
Laceration Care, Adult A laceration is a cut that may go through all layers of the skin and into the tissue that is right under the skin. Some lacerations heal on their own. Others need to be closed with stitches (sutures), staples, skin adhesive strips, or skin glue. Proper care of a laceration reduces the risk for infection, helps the laceration heal better, and may prevent scarring. General tips Keep the wound clean and dry. Do not scratch or pick at the wound. Wash your hands with soap and water for at least 20 seconds before and after touching your wound or changing your bandage (dressing). If soap and water are not available, use hand sanitizer. Do not usedisinfectants or antiseptics, such as rubbing alcohol, to clean your wound unless told by your health care provider. If you were given a dressing, you should change it at least once a day, or as told by your health care provider. You should also change it if it becomes wet or dirty. How to care for your laceration If sutures or staples were used: Keep the wound completely dry for the first 24 hours, or as told by your health care provider. After that time, you may shower or bathe. Do not soak your wound in water until after the sutures or staples have been removed. Clean the wound once each day, or as told by your health care provider. To do this: Wash the wound with soap and water. Rinse the wound with water to remove all soap. Pat the wound dry with a clean towel. Do not rub the wound. After cleaning the wound, apply a thin layer of antibiotic ointment, other topical ointments, or a non-adherent dressing as told by your health care provider. This will help prevent infection and keep the dressing from sticking to the wound. Have the sutures or staples removed as told by your health care provider. Do not  remove sutures or staples yourself. If skin adhesive strips were used: Do not get the skin adhesive strips wet. You may shower or bathe,  but keep the wound dry. If the wound gets wet, pat it dry with a clean towel. Do not rub the wound. Skin adhesive strips fall off on their own. If adhesive strip edges start to loosen and curl up, you may trim the loose edges. Do not remove adhesive strips completely unless your health care provider tells you to do that. If skin glue was used: You may shower or bathe, but try to keep the wound dry. Do not soak the wound in water. After showering or bathing, pat the wound dry with a clean towel. Do not rub the wound. Do not do any activities that will make you sweat a lot until the skin glue has fallen off. Do not apply liquid, cream, or ointment medicine to the wound while the skin glue is in place. Doing this may loosen the film before the wound has healed. If a dressing is placed over the wound, do not apply tape directly over the skin glue. Doing this may cause the glue to be pulled off before the wound has healed. Do not pick at the glue. Skin glue usually remains in place for 5-10 days and then falls off the skin. Follow these instructions at home: Medicines Take over-the-counter and prescription medicines only as told by your health care provider. If you were prescribed an antibiotic medicine or ointment, take or apply it as told by your health care provider. Do not stop using it even if  your condition improves. Managing pain and swelling If directed, put ice on the injured area. To do this: Put ice in a plastic bag. Place a towel between your skin and the bag. Leave the ice on for 20 minutes, 2-3 times a day. Remove the ice if your skin turns bright red. This is very important. If you cannot feel pain, heat, or cold, you have a greater risk of damage to the area. Raise (elevate) the injured area above the level of your heart while you are sitting or lying down for the first 24-48 hours after the laceration is repaired. General instructions  Avoid any activity that could cause your wound  to reopen. Check your wound every day for signs of infection. Watch for: More redness, swelling, or pain. Fluid or blood. Warmth. Pus or a bad smell. Keep all follow-up visits. This is important. Contact a health care provider if: You received a tetanus shot and you have swelling, severe pain, redness, or bleeding at the injection site. Your closed wound breaks open. You have any of these signs of infection: More redness, swelling, or pain around your wound. Fluid or blood coming from your wound. Warmth coming from your wound. Pus or a bad smell coming from your wound. A fever. You notice something coming out of the wound, such as wood or glass. Your pain is not controlled with medicine. You notice a change in the color of your skin near your wound. You need to change the dressing often. You develop a new rash. You have numbness around the wound. Get help right away if: You develop severe swelling around the wound. Your pain suddenly increases and is severe. You develop painful lumps near the wound or on skin anywhere else on your body. You have a red streak going away from your wound. The wound is on your hand or foot, and you cannot properly move a finger or toe. The wound is on your hand or foot, and you notice that your fingers or toes look pale or bluish. Summary A laceration is a cut that may go through all layers of the skin and into the tissue that is right under the skin. Some lacerations heal on their own. Others need to be closed with stitches (sutures), staples, skin adhesive strips, or skin glue. Proper care of a laceration reduces the risk of infection, helps the laceration heal better, and may prevent scarring. This information is not intended to replace advice given to you by your health care provider. Make sure you discuss any questions you have with your health care provider. Document Revised: 04/30/2020 Document Reviewed: 04/30/2020 Elsevier Patient Education   2024 [employer].

## 2023-06-01 NOTE — Progress Notes (Signed)
 Subjective:    Patient ID: Sydney Spencer, female    DOB: 01-Dec-1975, 48 y.o.   MRN: [id number]  48y/o caucasian female unpacking glass light shades from box.  Was unaware one had broken shard of glass cut left little finger applied pressure washing hands and came to clinic with supervisor for evaluation.  Patient stated had large laceration and seen at [facility] in the past 5 years had tetanus booster at that time.  Denied weakness in finger/normal strength hand grasp does not feel any glass chips in skin.  Did not injure nail.      Review of Systems  Constitutional:  Negative for chills, diaphoresis and fever.  HENT:  Negative for trouble swallowing and voice change.   Eyes:  Negative for photophobia and visual disturbance.  Respiratory:  Negative for cough, choking, chest tightness, shortness of breath, wheezing and stridor.   Gastrointestinal:  Negative for diarrhea, nausea and vomiting.  Genitourinary:  Negative for difficulty urinating.  Musculoskeletal:  Positive for myalgias. Negative for arthralgias, gait problem, joint swelling, neck pain and neck stiffness.  Skin:  Positive for wound. Negative for color change, pallor and rash.  Allergic/Immunologic: Positive for environmental allergies.  Neurological:  Negative for dizziness, tremors, seizures, syncope, facial asymmetry, speech difficulty, weakness, light-headedness, numbness and headaches.  Hematological:  Negative for adenopathy. Does not bruise/bleed easily.  Psychiatric/Behavioral:  Negative for agitation, confusion and sleep disturbance.        Objective:   Physical Exam Vitals and nursing note reviewed.  Constitutional:      General: She is awake. She is not in acute distress.    Appearance: Normal appearance. She is well-developed, well-groomed and normal weight. She is not ill-appearing, toxic-appearing or diaphoretic.  HENT:     Head: Normocephalic and atraumatic.     Jaw: There is normal jaw occlusion.      Salivary Glands: Right salivary gland is not diffusely enlarged. Left salivary gland is not diffusely enlarged.     Right Ear: Hearing and external ear normal. No decreased hearing noted.     Left Ear: Hearing and external ear normal. No decreased hearing noted.     Nose: Nose normal. No congestion or rhinorrhea.     Mouth/Throat:     Lips: Pink. No lesions.     Mouth: Mucous membranes are moist.     Pharynx: Oropharynx is clear.  Eyes:     General: Lids are normal. Vision grossly intact. Gaze aligned appropriately. No scleral icterus.       Right eye: No discharge.        Left eye: No discharge.     Extraocular Movements: Extraocular movements intact.     Conjunctiva/sclera: Conjunctivae normal.     Pupils: Pupils are equal, round, and reactive to light.  Neck:     Trachea: Trachea normal.  Cardiovascular:     Rate and Rhythm: Normal rate and regular rhythm.     Pulses: Normal pulses.  Pulmonary:     Effort: Pulmonary effort is normal. No respiratory distress.     Breath sounds: Normal breath sounds and air entry. No stridor or transmitted upper airway sounds. No wheezing.     Comments: Spoke full sentences without difficulty; no cough observed in exam room Abdominal:     General: Abdomen is flat.     Palpations: Abdomen is soft.  Musculoskeletal:        General: Tenderness and signs of injury present. No swelling. Normal range of motion.  Right elbow: No swelling, deformity, effusion or lacerations. Normal range of motion.     Left elbow: No swelling, deformity, effusion or lacerations. Normal range of motion.     Right forearm: No swelling, edema, deformity, lacerations or tenderness.     Left forearm: No swelling, edema, deformity, lacerations or tenderness.     Right wrist: No swelling, deformity, effusion, lacerations, tenderness or crepitus. Normal range of motion.     Left wrist: No swelling, deformity, effusion, lacerations, tenderness or crepitus. Normal range of  motion.     Right hand: No swelling, deformity, lacerations, tenderness or bony tenderness. Normal range of motion. Normal strength. Normal sensation. There is no disruption of two-point discrimination. Normal capillary refill.     Left hand: Deformity and tenderness present. No swelling, lacerations or bony tenderness. Normal range of motion. Normal strength. Normal sensation. There is no disruption of two-point discrimination. Normal capillary refill.     Cervical back: Normal range of motion and neck supple. No rigidity.     Right lower leg: No edema.     Left lower leg: No edema.     Comments: Full arom bilateral hands/fingers; tenderness with palpation adjacent to 5th digit laceration   EBL less than 1ml in clinic; washed with soap and water applied pressure to assist clotting x 5 minutes then applied steristrips x 2, finger splint fitted and distributed from clinic stock ; no bony tenderness or foreign body visualized  Lymphadenopathy:     Head:     Right side of head: No preauricular adenopathy.     Left side of head: No preauricular adenopathy.     Cervical: No cervical adenopathy.     Right cervical: No superficial cervical adenopathy.    Left cervical: No superficial cervical adenopathy.  Skin:    General: Skin is warm and dry.     Capillary Refill: Capillary refill takes less than 2 seconds.     Coloration: Skin is not ashen, cyanotic, jaundiced, mottled, pale or sallow.     Findings: Signs of injury and wound present. No abrasion, abscess, acne, bruising, burn, ecchymosis, erythema, laceration, lesion, petechiae or rash.     Nails: There is no clubbing.     Comments: U shaped 1cm flap laceration lateral 5th digit between DIP/PIP joints wound edges well approximated oozing when pressure removed capillary flow  Neurological:     General: No focal deficit present.     Mental Status: She is alert and oriented to person, place, and time. Mental status is at baseline.     GCS: GCS eye  subscore is 4. GCS verbal subscore is 5. GCS motor subscore is 6.     Cranial Nerves: Cranial nerves 2-12 are intact. No cranial nerve deficit, dysarthria or facial asymmetry.     Sensory: Sensation is intact.     Motor: Motor function is intact. No weakness, tremor, atrophy, abnormal muscle tone or seizure activity.     Coordination: Coordination is intact. Coordination normal.     Gait: Gait is intact. Gait normal.     Comments: In/out of chair and on/off exam table without difficulty; gait sure and steady in clinic; bilateral hand grasp equal 5/5  Psychiatric:        Attention and Perception: Attention and perception normal.        Mood and Affect: Mood and affect normal.        Speech: Speech normal.        Behavior: Behavior normal. Behavior is cooperative.  Thought Content: Thought content normal.        Cognition and Memory: Cognition and memory normal.        Judgment: Judgment normal.       Applied steristrips x2 and given bandaids/cobain and fitted distributed finger splint aluminum foam from clinic stock    Assessment & Plan:  A-finger laceration initial encounter 5th digit left; accidental cut  P-Dispensed augmentin 875mg  po BID x 7 days #20 RF0 from pdrx to patient start if purulent discharge/red streaks from wound/fever or chills and notify clinic staff as clinic closed tomorrow - Sunday 28-30 Mar.  Tetanus up to date.  Wound well approximated not gaping did require steristrips x2.  Sutures not available in this clinic and patient did not want to go to [facility].  Patient was instructed to rest finger this afternoon avoid heavy lifting with finger pressure wear splint the next couple days to allow area to heal. Do not soak left hand in dirty water until laceration healed avoid pool, lake, hot tub, dirty sink water. May shower apply neosporin/triple antibiotic BID keep wounds covered they will heal faster and prevent contamination rubbing from clothing tearing off scabs.  Given  triple antibiotic and bandaids, 1 roll cohesive bandage and finger splint foam/aluminum from clinic stock change at least daily after washing with soap and water.  Change prn soiling. [facility] handout on laceration care  Patient refused tylenol and motrin at this time.  Discussed may use ibuprofen 800mg  po q8h prn pain or/and  Tylenol 1000mg  po q6h prn pain over the counter  may ice 15 minutes prn swelling four times per day. Medications as directed. Call or return to clinic as needed if these symptoms worsen or fail to improve as anticipated or if worsening pain/discharge/foreign object seen. Do not dig in wound as may lead to secondary infection. Patient verbalized agreement and understanding of treatment plan. P2: ROM, injury

## 2023-06-07 ENCOUNTER — Telehealth: Payer: Self-pay | Admitting: Registered Nurse

## 2023-06-07 ENCOUNTER — Encounter: Payer: Self-pay | Admitting: Registered Nurse

## 2023-06-07 DIAGNOSIS — S61217D Laceration without foreign body of left little finger without damage to nail, subsequent encounter: Secondary | ICD-10-CM

## 2023-06-07 NOTE — Telephone Encounter (Signed)
 Patient reported healing and keeping covered at work with bandaid and gloves as she works in [employer] seen in [employer] 06/06/23  Skin warm dry and pink proximal and distal to bandage full arom normal capillary refill less than 2 seconds gait sure and steady respirations even and unlabored RA A&Ox3.

## 2023-06-27 ENCOUNTER — Ambulatory Visit: Admitting: Registered Nurse

## 2023-06-27 VITALS — BP 132/82 | Temp 98.0°F

## 2023-06-27 DIAGNOSIS — S40262A Insect bite (nonvenomous) of left shoulder, initial encounter: Secondary | ICD-10-CM

## 2023-06-27 MED ORDER — DOXYCYCLINE HYCLATE 100 MG PO TABS: 100.0000 mg | ORAL_TABLET | Freq: Two times a day (BID) | ORAL | Status: AC

## 2023-06-27 MED ORDER — DOXYCYCLINE HYCLATE 100 MG PO TABS: 100.0000 mg | ORAL_TABLET | Freq: Two times a day (BID) | ORAL | Status: DC

## 2023-06-27 NOTE — Patient Instructions (Signed)
Tick Bite Information, Adult  Ticks are insects that draw blood for food. They climb onto people and animals that brush against the leaves and grasses that they live in. They then bite and attach to the skin. Most ticks are harmless, but some ticks may carry germs that can cause disease. These germs are spread to a person through a bite. To lower your risk of getting a disease from a tick bite, make sure you: Take steps to prevent tick bites. Check for ticks after being outdoors where ticks live. Watch for symptoms of disease if a tick attached to you or if you think a tick bit you. How can I prevent tick bites? Take these steps to help prevent tick bites when you go outdoors in an area where ticks live: Before you go outdoors: Wear long sleeves and long pants to protect your skin from ticks. Wear light-colored clothing so you can see ticks easier. Tuck your pant legs into your socks. Apply insect repellent that has DEET (20% or higher), picaridin, or IR3535 in it to the following areas: Any bare skin. Avoid areas around the eyes and mouth. Edges of clothing, like the top of your boots, the bottom of your pant legs, and your sleeve cuffs. Consider applying an insect repellant that contains permethrin. Follow the instructions on the label. Do not apply permethrin directly to the skin. Instead, apply to the following areas: Clothing and shoes. Outdoor gear and tents. When you are outdoors: Avoid walking through areas with long grass. If you are walking on a trail, stay in the middle of the trail so your skin, hair, and clothing do not touch the bushes. Check for ticks on your clothing, hair, and skin often while you are outdoors. Check again before you go inside. When you go indoors: Check your clothing for ticks. Tumble dry clothes in a dryer on high heat for at least 10 minutes. If clothes are damp, additional time may be needed. If clothes require washing, use hot water. Check your gear and  pets. Shower soon after being outdoors. Check your body for ticks. Do a full body check using a mirror. Be sure to check your scalp, neck, armpits, waist, groin, and joint areas. These are the spots where ticks attach themselves most often. What is the best way to remove a tick?  Remove the tick as soon as possible. Removing it can prevent germs from passing to your body. Do not remove the tick with your bare fingers. Do not try to remove a tick with heat, alcohol, petroleum jelly, or fingernail polish. These things can cause the tick to salivate and regurgitate into your bloodstream, increasing your risk of getting a disease. To remove a tick that is crawling on your skin: Go outside and brush the tick off. Use tape or a lint roller. To remove a tick that is attached to your skin: Wash your hands. If you have gloves, put them on. Use a fine-tipped tweezer, curved forceps, or a tick-removal tool to gently grasp the tick as close to your skin and the tick's head as possible. Gently pull with a steady, upward, and even pressure until the tick lets go. While removing the tick: Take care to keep the tick's head attached to its body. Do not twist or jerk the tick. This can make the tick's head or mouth parts break off and stay in your skin. If this happens, try to remove the mouth parts with tweezers. If you cannot remove them, leave   the area alone and let the skin heal. Do not squeeze or crush the tick's body. This could force disease-carrying fluids from the tick into your body. What should I do after removing a tick? Clean the bite area and your hands with soap and water, rubbing alcohol, or an iodine scrub. If an antiseptic cream or ointment is available, put a small amount on the bite area. Wash and disinfect any tools that you used to remove the tick. How should I dispose of a tick? To dispose of a live tick, use one of these methods: Place it in rubbing alcohol. Place it in a sealed bag  or container, and throw it away. Wrap it tightly in tape, and throw it away. Flush it down the toilet. Where to find more information Centers for Disease Control and Prevention: cdc.gov/ticks [place] Environmental Protection Agency: epa.gov/insect-repellents Contact a health care provider if: You have symptoms of a disease after a tick bite. Symptoms of a tick-borne disease can occur from moments after the tick bites to 30 days after a tick is removed. Symptoms include: Fever or chills. A red rash that makes a circle (bull's-eye rash) in the bite area. Redness and swelling in the bite area. Headache or stiff neck. Muscle, joint, or bone pain. Abnormal tiredness. Numbness in your legs or trouble walking or moving your legs. Tender or swollen lymph glands. Abdominal pain, vomiting, diarrhea, or weight loss. Get help right away if: You are not able to remove a tick. You have muscle weakness or paralysis. Your symptoms get worse or you experience new symptoms. You find an engorged tick on your skin and you are in an area where there is a higher risk of disease from ticks. Summary Ticks may carry germs that can spread to a person through a bite. These germs can cause disease. Wear protective clothing and use insect repellent to prevent tick bites. Follow the instructions on the label. If you find a tick on your body, remove it as soon as possible. If the tick is attached, do not try to remove it with heat, alcohol, petroleum jelly, or fingernail polish. If you have symptoms of a disease after being bitten by a tick, contact a health care provider. This information is not intended to replace advice given to you by your health care provider. Make sure you discuss any questions you have with your health care provider. Document Revised: 05/24/2021 Document Reviewed: 05/24/2021 Elsevier Patient Education  2024 [employer]  

## 2023-06-27 NOTE — Progress Notes (Unsigned)
 See NP for a Tick bite on the left shoulder.

## 2023-06-28 ENCOUNTER — Encounter: Payer: Self-pay | Admitting: Registered Nurse

## 2023-06-28 NOTE — Progress Notes (Signed)
 Subjective:    Patient ID: Sydney Spencer, female    DOB: [birth year], 48 y.o.   MRN: [id number]  48y/o caucasian female established patient here for attempted removal embedded engorged tick 5 days ago and still has pincer stuck in skin visible  Has tried epsom salt paste, tweezers and scrubbing affected area but can still see pincer embedded in shoulder and requested NP remove it  Patient had been planting garden this weekend and noted tick when in the shower.    Review of Systems  Constitutional:  Negative for chills, diaphoresis, fatigue and fever.  HENT:  Negative for congestion, trouble swallowing and voice change.   Eyes:  Negative for photophobia and visual disturbance.  Respiratory:  Negative for cough, shortness of breath, wheezing and stridor.   Cardiovascular:  Negative for palpitations.  Gastrointestinal:  Negative for diarrhea, nausea and vomiting.  Genitourinary:  Negative for difficulty urinating.  Musculoskeletal:  Negative for gait problem, myalgias, neck pain and neck stiffness.  Skin:  Negative for color change, rash and wound.  Allergic/Immunologic: Positive for environmental allergies.  Neurological:  Negative for dizziness, tremors, syncope, facial asymmetry, speech difficulty, weakness, light-headedness, numbness and headaches.  Hematological:  Negative for adenopathy. Does not bruise/bleed easily.  Psychiatric/Behavioral:  Negative for agitation, confusion and sleep disturbance.        Objective:   Physical Exam Vitals and nursing note reviewed.  Constitutional:      General: She is awake. She is not in acute distress.    Appearance: Normal appearance. She is well-developed, well-groomed and normal weight. She is not ill-appearing, toxic-appearing or diaphoretic.  HENT:     Head: Normocephalic and atraumatic.     Jaw: There is normal jaw occlusion.     Salivary Glands: Right salivary gland is not diffusely enlarged. Left salivary gland is not diffusely  enlarged.     Right Ear: Hearing and external ear normal. No decreased hearing noted.     Left Ear: Hearing and external ear normal. No decreased hearing noted.     Nose: Nose normal. No congestion or rhinorrhea.     Right Sinus: No maxillary sinus tenderness or frontal sinus tenderness.     Left Sinus: No maxillary sinus tenderness or frontal sinus tenderness.     Mouth/Throat:     Lips: Pink. No lesions.     Mouth: Mucous membranes are moist. No oral lesions or angioedema.     Dentition: No gum lesions.     Tongue: No lesions. Tongue does not deviate from midline.     Palate: No mass and lesions.     Pharynx: Oropharynx is clear. Uvula midline. Postnasal drip present. No pharyngeal swelling, oropharyngeal exudate, posterior oropharyngeal erythema or uvula swelling.     Tonsils: No tonsillar exudate or tonsillar abscesses.  Eyes:     General: Lids are normal. Vision grossly intact. Gaze aligned appropriately. Allergic shiner present. No scleral icterus.       Right eye: No discharge.        Left eye: No discharge.     Extraocular Movements: Extraocular movements intact.     Conjunctiva/sclera: Conjunctivae normal.     Pupils: Pupils are equal, round, and reactive to light.  Neck:     Trachea: Trachea and phonation normal.  Cardiovascular:     Rate and Rhythm: Normal rate and regular rhythm.     Pulses: Normal pulses.          Radial pulses are 2+ on the right side and  2+ on the left side.     Heart sounds: Normal heart sounds.  Pulmonary:     Effort: Pulmonary effort is normal. No respiratory distress.     Breath sounds: Normal breath sounds and air entry. No stridor or transmitted upper airway sounds. No wheezing.     Comments: Spoke full sentences without difficulty; no cough observed in exam room Abdominal:     General: Abdomen is flat.     Palpations: Abdomen is soft.  Musculoskeletal:        General: Normal range of motion.     Right hand: Normal strength. Normal capillary  refill.     Left hand: Normal strength. Normal capillary refill.     Cervical back: Normal range of motion and neck supple. No swelling, edema, deformity, erythema, signs of trauma, lacerations, rigidity, spasms, torticollis, tenderness or crepitus. No pain with movement or muscular tenderness. Normal range of motion.     Thoracic back: No swelling, edema, deformity, signs of trauma, lacerations, spasms or tenderness. Normal range of motion.     Right lower leg: No edema.     Left lower leg: No edema.  Lymphadenopathy:     Head:     Right side of head: No submental, submandibular, tonsillar, preauricular, posterior auricular or occipital adenopathy.     Left side of head: No submental, submandibular, tonsillar, preauricular, posterior auricular or occipital adenopathy.     Cervical: No cervical adenopathy.     Right cervical: No superficial, deep or posterior cervical adenopathy.    Left cervical: No superficial, deep or posterior cervical adenopathy.  Skin:    General: Skin is warm and dry.     Capillary Refill: Capillary refill takes less than 2 seconds.     Coloration: Skin is not ashen, cyanotic, jaundiced, mottled, pale or sallow.     Findings: Abrasion present. No abscess, acne, bruising, burn, ecchymosis, erythema, signs of injury, laceration, lesion, petechiae, rash or wound. Rash is not crusting, nodular, papular, purpuric, pustular, urticarial or vesicular.          Comments: Nummular scab brown 1cm dry left anterior shoulder superior to collar bone and distal to trapezius muscle nontender  Neurological:     General: No focal deficit present.     Mental Status: She is alert and oriented to person, place, and time. Mental status is at baseline.     GCS: GCS eye subscore is 4. GCS verbal subscore is 5. GCS motor subscore is 6.     Cranial Nerves: No cranial nerve deficit, dysarthria or facial asymmetry.     Sensory: Sensation is intact.     Motor: Motor function is intact. No  weakness, tremor, atrophy, abnormal muscle tone or seizure activity.     Coordination: Coordination is intact. Coordination normal.     Gait: Gait is intact. Gait normal.     Comments: In/out of chair without difficulty; gait sure and steady in clinic; bilateral hand grasp equal 5/5  Psychiatric:        Attention and Perception: Attention and perception normal.        Mood and Affect: Mood and affect normal.        Speech: Speech normal.        Behavior: Behavior normal. Behavior is cooperative.        Thought Content: Thought content normal.        Cognition and Memory: Cognition and memory normal.        Judgment: Judgment normal.  Assessment & Plan:  A-tick bite initial encounter  P-Used 10 blade scalpal removed pincer lodged in skin left shoulder EBL minimal obtained verbal consent for procedure and cleansed area with iodine.  Patient tolerated procedure well discussed due to unsure how many days engorged/embedded will treat for lyme disease doxycycline  100mg  po BID x 10 days #20 RF0 dispensed from [facility] to patient along with 3x4 inch bandaids x 4 from clinic stock and triple antitiobic ointment applied to area before covering with bandage.  Did not require oral steroids.  Avoid scratching and picking at areas ticks removed.  May apply calagel 2% apply BID prn itching and triple antibiotic topical BID or aquaphor/eucerin/vaseline to speed healing.  Wash affected areas daily with soap and water.  May apply ice 15 minutes po TID prn pain/swelling.  Exitcare handouts on tick bite printed and given to patient.  RTC if new symptoms e.g. discharge, fever, chills, joint aches.  If rash has worsening erythema, pain, purulent discharge, fever to notify clinic staff/seek re-evaluation with provider. Discussed treatment of clothes with permethrin and using DEET on skin that is not covered.  Discussed follow manufacturers instructions for permethrin reapplication typically after a set number of  launderings and ensure when applying permethrin to clothes in well ventilated area.  Patient verbalized understanding, agreed with plan of care and had no further questions at this time.
